# Patient Record
Sex: Female | Born: 2000 | Race: White | Hispanic: No | Marital: Single | Smoking: Never smoker
Health system: Southern US, Community
[De-identification: ages and names within clinical notes are randomized; demographics above are authoritative.]

## PROBLEM LIST (undated history)

## (undated) HISTORY — PX: TONSILLECTOMY: SUR1361

## (undated) HISTORY — PX: WISDOM TOOTH EXTRACTION: SHX21

---
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
## 2019-03-25 ENCOUNTER — Other Ambulatory Visit: Payer: Self-pay

## 2019-03-25 DIAGNOSIS — Z20822 Contact with and (suspected) exposure to covid-19: Secondary | ICD-10-CM

## 2019-03-27 ENCOUNTER — Emergency Department
Admission: EM | Admit: 2019-03-27 | Discharge: 2019-03-27 | Disposition: A | Discharge status: Home / Self Care | Payer: Self-pay | Attending: Emergency Medicine | Admitting: Emergency Medicine

## 2019-03-27 ENCOUNTER — Emergency Department: Payer: Self-pay

## 2019-03-27 ENCOUNTER — Encounter: Payer: Self-pay | Admitting: Emergency Medicine

## 2019-03-27 ENCOUNTER — Other Ambulatory Visit: Payer: Self-pay

## 2019-03-27 DIAGNOSIS — Z79899 Other long term (current) drug therapy: Secondary | ICD-10-CM | POA: Insufficient documentation

## 2019-03-27 DIAGNOSIS — H1033 Unspecified acute conjunctivitis, bilateral: Secondary | ICD-10-CM

## 2019-03-27 DIAGNOSIS — J029 Acute pharyngitis, unspecified: Secondary | ICD-10-CM | POA: Insufficient documentation

## 2019-03-27 DIAGNOSIS — H1089 Other conjunctivitis: Secondary | ICD-10-CM | POA: Insufficient documentation

## 2019-03-27 LAB — NOVEL CORONAVIRUS, NAA: SARS-CoV-2, NAA: NOT DETECTED

## 2019-03-27 LAB — GROUP A STREP BY PCR: Group A Strep by PCR: NOT DETECTED

## 2019-03-27 MED ORDER — TOBRAMYCIN 0.3 % OP SOLN: 2.0000 [drp] | OPHTHALMIC | 0 refills | Status: DC

## 2019-03-27 NOTE — ED Triage Notes (Signed)
Pt via POV from home. Pt c/o bilateral eye pain, swelling, and redness. Pt states that it been like this for about a week. Pt also c/o sore throat for a couple of days. Redness and swelling noted to both eyes  Pt is A&Ox4, respiration are equal and unlabored, Pt NAD at this time.

## 2019-03-27 NOTE — Discharge Instructions (Signed)
Follow-up with your regular doctor if not improving in 3 days.  Return emergency department if worsening.  Take medication as prescribed. 

## 2019-03-27 NOTE — ED Provider Notes (Signed)
[facility] Emergency Department Provider Note  ____________________________________________   None    (approximate)  I have reviewed the triage vital signs and the nursing notes.   HISTORY  Chief Complaint Eye Pain    HPI Kathryn Payne is a 18 y.o. female presents emergency department complaining of bilateral eye pain and swelling along with redness.  Symptoms for approximately 5 to 6 days.  Sore throat for 2 days.  She is also had some chest discomfort with a mild cough.  She states she was tested for Covid through the drive-through here at the hospital 3 days ago with a negative result.  She denies any known fever.  She states she is still symptomatic so she is afraid to go back to work.  She denies any vomiting or diarrhea.   No recent STD   History reviewed. No pertinent past medical history.  There are no active problems to display for this patient.   Past Surgical History:  Procedure Laterality Date  . TONSILLECTOMY    . WISDOM TOOTH EXTRACTION      Prior to Admission medications   Medication Sig Start Date End Date Taking? Authorizing Provider  tobramycin (TOBREX) 0.3 % ophthalmic solution Place 2 drops into the left eye every 4 (four) hours. 03/27/19   Caryn Section Linden Dolin, PA-C  albuterol (PROVENTIL HFA;VENTOLIN HFA) [phone] Base) MCG/ACT inhaler Inhale 2 puffs into the lungs every 4 (four) hours as needed for wheezing or shortness of breath. 06/16/18 03/27/19  Cuthriell, Charline Bills, PA-C    Allergies Patient has no known allergies.  History reviewed. No pertinent family history.  Social History Social History   Tobacco Use  . Smoking status: Never Smoker  . Smokeless tobacco: Never Used  Substance Use Topics  . Alcohol use: Never    Frequency: Never  . Drug use: Never    Review of Systems  Constitutional: No fever/chills Eyes: No visual changes.  Positive for red eyes ENT: Positive sore throat. Respiratory: Positive cough with  some chest discomfort Genitourinary: Negative for dysuria. Musculoskeletal: Negative for back pain. Skin: Negative for rash.    ____________________________________________   PHYSICAL EXAM:  VITAL SIGNS: ED Triage Vitals  Enc Vitals Group     BP 03/27/19 1608 124/76     Pulse Rate 03/27/19 1608 98     Resp 03/27/19 1608 16     Temp 03/27/19 1608 97.8 F (36.6 C)     Temp Source 03/27/19 1608 Oral     SpO2 03/27/19 1608 98 %     Weight 03/27/19 1609 100 lb (45.4 kg)     Height 03/27/19 1609 5' (1.524 m)     Head Circumference --      Peak Flow --      Pain Score 03/27/19 1608 10     Pain Loc --      Pain Edu? --      Excl. in [place]? --     Constitutional: Alert and oriented. Well appearing and in no acute distress. Eyes: Conjunctivae are injected bilaterally  head: Atraumatic. Nose: No congestion/rhinnorhea. Mouth/Throat: Mucous membranes are moist.   Neck:  supple no lymphadenopathy noted Cardiovascular: Normal rate, regular rhythm. Heart sounds are normal Respiratory: Normal respiratory effort.  No retractions, lungs c t a  GU: deferred Musculoskeletal: FROM all extremities, warm and well perfused Neurologic:  Normal speech and language.  Skin:  Skin is warm, dry and intact. No rash noted. Psychiatric: Mood and affect are normal. Speech  and behavior are normal.  ____________________________________________   LABS (all labs ordered are listed, but only abnormal results are displayed)  Labs Reviewed  GROUP A STREP BY PCR   ____________________________________________   ____________________________________________  RADIOLOGY  Chest x-ray is normal  ____________________________________________   PROCEDURES  Procedure(s) performed: No  Procedures    ____________________________________________   INITIAL IMPRESSION / ASSESSMENT AND PLAN / ED COURSE  Pertinent labs & imaging results that were available during my care of the patient were reviewed  by me and considered in my medical decision making (see chart for details).   Patient is 18 year old female presents to the emergency department complaining of red eyes, sore throat, cough with some chest discomfort.  Had a Covid test 3 days ago which was negative.  Physical exam shows conjunctiva to be injected, throat is red, lungs clear to auscultation  Strep test and chest x-ray ordered.    ----------------------------------------- 5:09 PM on 03/27/2019 -----------------------------------------  Chest x-ray is normal Strep test negative  Explained the results to the patient.  She was given tobramycin ophthalmic drops for the conjunctivitis.  Explained to her that her chest x-ray looks good I do not feel like she has Covid due to the negative chest x-ray and negative Covid test 3 days ago.  If she has improved symptoms she can return to work in 2 to 3 days.  She is given a work note.  She is discharged stable condition. Kathryn Payne was evaluated in Emergency Department on 03/27/2019 for the symptoms described in the history of present illness. She was evaluated in the context of the global COVID-19 pandemic, which necessitated consideration that the patient might be at risk for infection with the SARS-CoV-2 virus that causes COVID-19. Institutional protocols and algorithms that pertain to the evaluation of patients at risk for COVID-19 are in a state of rapid change based on information released by regulatory bodies including the CDC and federal and state organizations. These policies and algorithms were followed during the patient's care in the ED.   As part of my medical decision making, I reviewed the following data within the electronic MEDICAL RECORD NUMBER Nursing notes reviewed and incorporated, Labs reviewed , Old chart reviewed, Radiograph reviewed , Notes from prior ED visits and [place] Controlled Substance Database  ____________________________________________   FINAL CLINICAL  IMPRESSION(S) / ED DIAGNOSES  Final diagnoses:  Acute bacterial conjunctivitis of both eyes  Acute pharyngitis, unspecified etiology      NEW MEDICATIONS STARTED DURING THIS VISIT:  Discharge Medication List as of 03/27/2019  5:45 PM       Note:  This document was prepared using Dragon voice recognition software and may include unintentional dictation errors.    Faythe Ghee, PA-C 03/27/19 1755    Dionne Bucy, MD 03/28/19 [phone]

## 2019-03-27 NOTE — ED Notes (Signed)
Pt verbalized understanding of discharge instructions. NAD at this time. 

## 2020-01-28 ENCOUNTER — Emergency Department: Payer: Self-pay

## 2020-01-28 ENCOUNTER — Inpatient Hospital Stay
Admission: EM | Admit: 2020-01-28 | Discharge: 2020-02-01 | DRG: 872 | Disposition: A | Discharge status: Home / Self Care | Payer: Self-pay | Attending: Hospitalist | Admitting: Hospitalist

## 2020-01-28 ENCOUNTER — Encounter: Payer: Self-pay | Admitting: Emergency Medicine

## 2020-01-28 ENCOUNTER — Other Ambulatory Visit: Payer: Self-pay

## 2020-01-28 DIAGNOSIS — M940 Chondrocostal junction syndrome [Tietze]: Secondary | ICD-10-CM | POA: Diagnosis not present

## 2020-01-28 DIAGNOSIS — E871 Hypo-osmolality and hyponatremia: Secondary | ICD-10-CM | POA: Diagnosis present

## 2020-01-28 DIAGNOSIS — M62838 Other muscle spasm: Secondary | ICD-10-CM | POA: Diagnosis not present

## 2020-01-28 DIAGNOSIS — A419 Sepsis, unspecified organism: Principal | ICD-10-CM | POA: Diagnosis present

## 2020-01-28 DIAGNOSIS — K529 Noninfective gastroenteritis and colitis, unspecified: Principal | ICD-10-CM | POA: Diagnosis present

## 2020-01-28 DIAGNOSIS — Z79899 Other long term (current) drug therapy: Secondary | ICD-10-CM

## 2020-01-28 DIAGNOSIS — E876 Hypokalemia: Secondary | ICD-10-CM | POA: Diagnosis present

## 2020-01-28 DIAGNOSIS — R197 Diarrhea, unspecified: Secondary | ICD-10-CM

## 2020-01-28 DIAGNOSIS — R109 Unspecified abdominal pain: Secondary | ICD-10-CM

## 2020-01-28 DIAGNOSIS — Z20822 Contact with and (suspected) exposure to covid-19: Secondary | ICD-10-CM | POA: Diagnosis present

## 2020-01-28 LAB — CBC WITH DIFFERENTIAL/PLATELET
Abs Immature Granulocytes: 0.25 10*3/uL — ABNORMAL HIGH (ref 0.00–0.07)
Basophils Absolute: 0.1 10*3/uL (ref 0.0–0.1)
Basophils Relative: 0 %
Eosinophils Absolute: 0 10*3/uL (ref 0.0–0.5)
Eosinophils Relative: 0 %
HCT: 37.9 % (ref 36.0–46.0)
Hemoglobin: 13 g/dL (ref 12.0–15.0)
Immature Granulocytes: 1 %
Lymphocytes Relative: 2 %
Lymphs Abs: 0.5 10*3/uL — ABNORMAL LOW (ref 0.7–4.0)
MCH: 32.4 pg (ref 26.0–34.0)
MCHC: 34.3 g/dL (ref 30.0–36.0)
MCV: 94.5 fL (ref 80.0–100.0)
Monocytes Absolute: 1.1 10*3/uL — ABNORMAL HIGH (ref 0.1–1.0)
Monocytes Relative: 5 %
Neutro Abs: 21.1 10*3/uL — ABNORMAL HIGH (ref 1.7–7.7)
Neutrophils Relative %: 92 %
Platelets: 274 10*3/uL (ref 150–400)
RBC: 4.01 MIL/uL (ref 3.87–5.11)
RDW: 12.2 % (ref 11.5–15.5)
WBC: 23 10*3/uL — ABNORMAL HIGH (ref 4.0–10.5)
nRBC: 0 % (ref 0.0–0.2)

## 2020-01-28 LAB — URINALYSIS, COMPLETE (UACMP) WITH MICROSCOPIC
Bilirubin Urine: NEGATIVE
Glucose, UA: NEGATIVE mg/dL
Ketones, ur: 20 mg/dL — AB
Leukocytes,Ua: NEGATIVE
Nitrite: NEGATIVE
Protein, ur: 100 mg/dL — AB
Specific Gravity, Urine: 1.023 (ref 1.005–1.030)
pH: 5 (ref 5.0–8.0)

## 2020-01-28 LAB — PREGNANCY, URINE: Preg Test, Ur: NEGATIVE

## 2020-01-28 LAB — COMPREHENSIVE METABOLIC PANEL
ALT: 10 U/L (ref 0–44)
AST: 16 U/L (ref 15–41)
Albumin: 4.1 g/dL (ref 3.5–5.0)
Alkaline Phosphatase: 37 U/L — ABNORMAL LOW (ref 38–126)
Anion gap: 11 (ref 5–15)
BUN: 11 mg/dL (ref 6–20)
CO2: 22 mmol/L (ref 22–32)
Calcium: 8.4 mg/dL — ABNORMAL LOW (ref 8.9–10.3)
Chloride: 101 mmol/L (ref 98–111)
Creatinine, Ser: 0.9 mg/dL (ref 0.44–1.00)
GFR, Estimated: >60 mL/min (ref >60)
Glucose, Bld: 109 mg/dL — ABNORMAL HIGH (ref 70–99)
Potassium: 3 mmol/L — ABNORMAL LOW (ref 3.5–5.1)
Sodium: 134 mmol/L — ABNORMAL LOW (ref 135–145)
Total Bilirubin: 1.2 mg/dL (ref 0.3–1.2)
Total Protein: 7.9 g/dL (ref 6.5–8.1)

## 2020-01-28 LAB — RESPIRATORY PANEL BY RT PCR (FLU A&B, COVID)
Influenza A by PCR: NEGATIVE
Influenza B by PCR: NEGATIVE
SARS Coronavirus 2 by RT PCR: NEGATIVE

## 2020-01-28 MED ORDER — IOHEXOL 300 MG/ML  SOLN: 75.0000 mL | Freq: Once | INTRAMUSCULAR | Status: DC | PRN

## 2020-01-28 MED ORDER — PIPERACILLIN-TAZOBACTAM 3.375 G IVPB 30 MIN
3.3750 g | Freq: Once | INTRAVENOUS | Status: AC
  Administered 2020-01-28: 3.375 g via INTRAVENOUS
  Filled 2020-01-28: qty 50

## 2020-01-28 MED ORDER — SODIUM CHLORIDE 0.9 % IV BOLUS
1000.0000 mL | Freq: Once | INTRAVENOUS | Status: AC
  Administered 2020-01-28: 1000 mL via INTRAVENOUS

## 2020-01-28 MED ORDER — ENOXAPARIN SODIUM 40 MG/0.4ML ~~LOC~~ SOLN: 40.0000 mg | SUBCUTANEOUS | Status: DC

## 2020-01-28 MED ORDER — ACETAMINOPHEN 325 MG PO TABS: 650.0000 mg | ORAL_TABLET | Freq: Four times a day (QID) | ORAL | Status: DC | PRN

## 2020-01-28 MED ORDER — METRONIDAZOLE IN NACL 5-0.79 MG/ML-% IV SOLN
500.0000 mg | Freq: Three times a day (TID) | INTRAVENOUS | Status: DC
  Administered 2020-01-29 – 2020-02-01 (×10): 500 mg via INTRAVENOUS
  Filled 2020-01-28 (×14): qty 100

## 2020-01-28 MED ORDER — IOHEXOL 300 MG/ML  SOLN
90.0000 mL | Freq: Once | INTRAMUSCULAR | Status: AC | PRN
  Administered 2020-01-28: 90 mL via INTRAVENOUS

## 2020-01-28 MED ORDER — SODIUM CHLORIDE 0.9 % IV SOLN
2.0000 g | INTRAVENOUS | Status: DC
  Administered 2020-01-29 – 2020-01-31 (×3): 2 g via INTRAVENOUS
  Filled 2020-01-28 (×3): qty 20

## 2020-01-28 MED ORDER — ACETAMINOPHEN 650 MG RE SUPP
650.0000 mg | Freq: Four times a day (QID) | RECTAL | Status: DC | PRN
  Administered 2020-01-29: 650 mg via RECTAL
  Filled 2020-01-28: qty 1

## 2020-01-28 MED ORDER — SODIUM CHLORIDE 0.9 % IV SOLN: INTRAVENOUS | Status: DC

## 2020-01-28 MED ORDER — MORPHINE SULFATE (PF) 2 MG/ML IV SOLN
0.5000 mg | INTRAVENOUS | Status: DC | PRN
  Administered 2020-01-29 (×2): 0.5 mg via INTRAVENOUS
  Filled 2020-01-28 (×2): qty 1

## 2020-01-28 NOTE — ED Notes (Signed)
Provider notified of heart rate in the one-teens to 120s.

## 2020-01-28 NOTE — H&P (Signed)
History and Physical    Kathryn Payne ZOX:[id number] DOB: 15-Nov-2000 DOA: 01/28/2020  PCP: Patient, No Pcp Per  Patient coming from: Home I have personally briefly reviewed patient's [facility]  Chief Complaint: Abdominal pain and diarrhea  HPI: Kathryn PlummerJaden Beggs is a 19 y.o. female with no significant past medical history who presents with concerns of acute abdominal pain and diarrhea.  Yesterday patient noted acute onset of excruciating sharp umbilical pain that radiated to her right upper quadrant.  States it was so painful that she cannot get out of bed or urinate.  She noticed at least 5 episodes of black-colored runny stool.  Also felt nauseous and dizzy but denies any vomiting.  Had fever of 103 at home. No new foods or recent travel. No history of abdominal surgery.  Patient is sexually active and last had sex about 2 weeks ago.  Denies any history of abdominal issues in the past.  States mom's boyfriend who lives with her had some GI issues with vomiting a few days ago but this has improved.  No family history of Crohn's disease.  Denies tobacco or illicit drug use.  Occasional alcohol use but not recently.  ED Course: She had a temperature of 100F, was tachycardic and tachypneic on ambient air.  A significant leukocytosis of 23.  Mild hyponatremia 134, hypokalemia of 3. Negative UA, negative pregnancy test.  CT abdomen pelvis showed normal appendix but had thickening and fluid-filled distal ileum and colon suggestive of infectious/inflammatory enterocolitis.  Review of Systems: Constitutional: No Weight Change, + Fever ENT/Mouth: No sore throat, No Rhinorrhea Eyes: No Eye Pain, No Vision Changes Cardiovascular: No Chest Pain, no SOB Respiratory: No Cough, No Sputum Gastrointestinal: + Nausea, No Vomiting, + Diarrhea, No Constipation, + Pain Genitourinary: no Urinary Incontinence Musculoskeletal: No Arthralgias, No Myalgias Skin: No Skin Lesions, No  Pruritus, Neuro: no Weakness, No Numbness,  No Loss of Consciousness, No Syncope Psych: No Anxiety/Panic, No Depression, +decrease appetite Heme/Lymph: No Bruising, No Bleeding  Past Surgical History:  Procedure Laterality Date  . TONSILLECTOMY    . WISDOM TOOTH EXTRACTION       reports that she has never smoked. She has never used smokeless tobacco. She reports that she does not drink alcohol and does not use drugs. Social History  No Known Allergies  No family history on file.   Prior to Admission medications   Medication Sig Start Date End Date Taking? Authorizing Provider  tobramycin (TOBREX) 0.3 % ophthalmic solution Place 2 drops into the left eye every 4 (four) hours. Patient not taking: Reported on 01/28/2020 03/27/19   Faythe GheeFisher, Susan W, PA-C  albuterol (PROVENTIL HFA;VENTOLIN HFA) 108 [phone](90 Base) MCG/ACT inhaler Inhale 2 puffs into the lungs every 4 (four) hours as needed for wheezing or shortness of breath. 06/16/18 03/27/19  Racheal Patchesuthriell, Jonathan D, PA-C    Physical Exam: Vitals:   01/28/20 1844 01/28/20 1852 01/28/20 2100 01/28/20 2130  BP:  (!) 123/91 111/79 120/78  Pulse:  (!) 103 (!) 111 (!) 115  Resp:  (!) 25 (!) 24 (!) 24  Temp:  100 F (37.8 C)    TempSrc:  Oral    SpO2:  100% 99% 98%  Weight: 40.8 kg     Height: 5' (1.524 m)       Constitutional: NAD, calm, comfortable, nontoxic appearing thin young female laying flat in bed Vitals:   01/28/20 1844 01/28/20 1852 01/28/20 2100 01/28/20 2130  BP:  (!) 123/91 111/79  120/78  Pulse:  (!) 103 (!) 111 (!) 115  Resp:  (!) 25 (!) 24 (!) 24  Temp:  100 F (37.8 C)    TempSrc:  Oral    SpO2:  100% 99% 98%  Weight: 40.8 kg     Height: 5' (1.524 m)      Eyes: PERRL, lids and conjunctivae normal ENMT: Mucous membranes are moist.  Neck: normal, supple Respiratory: clear to auscultation bilaterally, no wheezing, no crackles. Normal respiratory effort. No accessory muscle use.  Cardiovascular: Tachycardia with normal  rhythm, no murmurs / rubs / gallops. No extremity edema.  Abdomen: Diffuse tenderness with mild palpation but no rebound, guarding or rigidity. Bowel sounds positive.  Musculoskeletal: no clubbing / cyanosis. No joint deformity upper and lower extremities. Good ROM, no contractures. Normal muscle tone.  Skin: no rashes, lesions, ulcers. No induration Neurologic: CN 2-12 grossly intact. Sensation intact,  Strength 5/5 in all 4.  Psychiatric: Normal judgment and insight. Alert and oriented x 3. Normal mood.     Labs on Admission: I have personally reviewed following labs and imaging studies  CBC: Recent Labs  Lab 01/28/20 1849  WBC 23.0*  NEUTROABS 21.1*  HGB 13.0  HCT 37.9  MCV 94.5  PLT 274   Basic Metabolic Panel: Recent Labs  Lab 01/28/20 1849  NA 134*  K 3.0*  CL 101  CO2 22  GLUCOSE 109*  BUN 11  CREATININE 0.90  CALCIUM 8.4*   GFR: Estimated Creatinine Clearance: 64.2 mL/min (by C-G formula based on SCr of 0.9 mg/dL). Liver Function Tests: Recent Labs  Lab 01/28/20 1849  AST 16  ALT 10  ALKPHOS 37*  BILITOT 1.2  PROT 7.9  ALBUMIN 4.1   No results for input(s): LIPASE, AMYLASE in the last 168 hours. No results for input(s): AMMONIA in the last 168 hours. Coagulation Profile: No results for input(s): INR, PROTIME in the last 168 hours. Cardiac Enzymes: No results for input(s): CKTOTAL, CKMB, CKMBINDEX, TROPONINI in the last 168 hours. BNP (last 3 results) No results for input(s): PROBNP in the last 8760 hours. HbA1C: No results for input(s): HGBA1C in the last 72 hours. CBG: No results for input(s): GLUCAP in the last 168 hours. Lipid Profile: No results for input(s): CHOL, HDL, LDLCALC, TRIG, CHOLHDL, LDLDIRECT in the last 72 hours. Thyroid Function Tests: No results for input(s): TSH, T4TOTAL, FREET4, T3FREE, THYROIDAB in the last 72 hours. Anemia Panel: No results for input(s): VITAMINB12, FOLATE, FERRITIN, TIBC, IRON, RETICCTPCT in the last 72  hours. Urine analysis:    Component Value Date/Time   COLORURINE AMBER (A) 01/28/2020 2000   APPEARANCEUR CLOUDY (A) 01/28/2020 2000   APPEARANCEUR Cloudy 01/16/2014 2332   LABSPEC 1.023 01/28/2020 2000   LABSPEC 1.014 01/16/2014 2332   PHURINE 5.0 01/28/2020 2000   GLUCOSEU NEGATIVE 01/28/2020 2000   GLUCOSEU Negative 01/16/2014 2332   HGBUR MODERATE (A) 01/28/2020 2000   BILIRUBINUR NEGATIVE 01/28/2020 2000   BILIRUBINUR Negative 01/16/2014 2332   KETONESUR 20 (A) 01/28/2020 2000   PROTEINUR 100 (A) 01/28/2020 2000   NITRITE NEGATIVE 01/28/2020 2000   LEUKOCYTESUR NEGATIVE 01/28/2020 2000   LEUKOCYTESUR 3+ 01/16/2014 2332    Radiological Exams on Admission: CT ABDOMEN PELVIS W CONTRAST  Result Date: 01/28/2020 CLINICAL DATA:  Right lower rib pain which began yesterday, loose bowel movements yesterday EXAM: CT ABDOMEN AND PELVIS WITH CONTRAST TECHNIQUE: Multidetector CT imaging of the abdomen and pelvis was performed using the standard protocol following bolus administration of intravenous  contrast. CONTRAST:  46mL OMNIPAQUE IOHEXOL 300 MG/ML  SOLN COMPARISON:  None. FINDINGS: Upper abdominal/lower chest imaging with extensive respiratory motion artifact, reimaged with improved acquisition. Lower chest: Lung bases are clear. Normal heart size. No pericardial effusion. Hepatobiliary: No worrisome focal liver lesions. Smooth liver surface contour. Normal hepatic attenuation. Normal gallbladder and biliary tree. Pancreas: Unremarkable. No pancreatic ductal dilatation or surrounding inflammatory changes. Spleen: Normal in size. No concerning splenic lesions. Adrenals/Urinary Tract: Normal adrenal glands. Kidneys are normally located with symmetric enhancement. No suspicious renal lesion, urolithiasis or hydronephrosis. Mild bladder wall thickening, more pronounced towards the bladder dome, possibly reactive. Stomach/Bowel: Distal esophagus and stomach are unremarkable. Duodenum with normal  sweep across the midline abdomen. No proximal small bowel thickening or dilatation. However, the ileum, most notably towards the terminal segments appears diffusely thickened and fluid-filled with adjacent hazy stranding of the mesentery and increased vascularity and numerous reactive adenopathy. Similar appearance seen throughout the colon with mild pancolonic mural thickening and mucosal hyperemia. Lack of formed stool suggesting the diarrheal illness/rapid transit state. A normal air-filled appendix is present in the right lower quadrant coursing towards the midline pelvis. No evidence of bowel obstruction. Vascular/Lymphatic: Numerous prominent though nonenlarged mesenteric nodes may be reactive. No pathologically enlarged abdominopelvic adenopathy within the included lymphatic chains. Increased vascularity of the mesentery, possibly reactive without other significant vascular findings. Reproductive: Normal anteverted uterus. Likely collapsing/involuting corpus luteum in the left ovary, normal physiologic finding in a reproductive age female. No concerning adnexal lesions. Other: Trace free fluid in the pelvis, possibly reactive or physiologic in a reproductive age female. Mesenteric hazy stranding noted predominantly in the anterior mesentery and omentum of the low anterior abdomen (2/66). No free abdominopelvic air. No bowel containing hernias. Musculoskeletal: No acute osseous abnormality or suspicious osseous lesion. Musculature is normal and symmetric. IMPRESSION: 1. Hyperemic, mildly thickened and fluid-filled distal ileum and colon. Lack of formed stool suggesting the diarrheal illness/rapid transit state. Findings are suggestive of an infectious or inflammatory enterocolitis. 2. Mild bladder wall thickening, more pronounced towards the bladder dome, possibly reactive in a reproductive age female. Recommend correlation for urinary symptoms and consideration of urinalysis to exclude cystitis. 3. Trace free  fluid in the pelvis, possibly reactive or physiologic in a reproductive age female. No free air. Electronically Signed   By: Kreg Shropshire M.D.   On: 01/28/2020 22:05      Assessment/Plan Sepsis secondary to enterocolitis Patient was febrile, tachycardic and tachypneic with elevated leukocytosis on admission Received 2 L of IV normal saline bolus in the ED.  Continue IV normal saline infusion Received IV Zosyn in the ED.  We will continue with IV Rocephin and Flagyl. Patient does not have personal history of abdominal issues or any family history of Crohn's disease so there is could just be an isolated incident.  Mild hyponatremia Sodium of 134 Follow labs in the morning following IV fluid  Mild hypokalemia K of 3.  Follow labs in the morning.  DVT prophylaxis:.Lovenox Code Status: Full Family Communication: Plan discussed with patient at bedside  disposition Plan: Home with observation Consults called:  Admission status: Observation  Status is: Observation  The patient remains OBS appropriate and will d/c before 2 midnights.  Dispo: The patient is from: Home              Anticipated d/c is to: Home              Anticipated d/c date is: 1 day  Patient currently is not medically stable to d/c.         Anselm Jungling DO [facility]   If 7PM-7AM, please contact night-coverage [email or web address]   01/28/2020, 11:46 PM

## 2020-01-28 NOTE — ED Triage Notes (Addendum)
Pt via EMS from home. Pt had severe RLQ abdominal pain that started yesterday, with some loose BM yesterday that pt states was black and tarry. Denies NV. Pt states at some point yesterday she was unable to control her stool.   103.0, mom gave 650 of Tylenol approx 2 hours ago, fever decreased to 101.3. HR 140s. 100% RA. 150/80, 111 CBG of Fentanyl and 4mg  of Zofran given by EMS

## 2020-01-28 NOTE — ED Provider Notes (Signed)
[facility] Emergency Department Provider Note   ____________________________________________   I have reviewed the triage vital signs and the nursing notes.   HISTORY  Chief Complaint Abdominal Pain   History limited by: Not Limited   HPI Kathryn Payne is a 19 y.o. female who presents to the emergency department today because of concerns for abdominal pain.  The pain started yesterday.  Initially located in the suprapubic region and is now located more in the right lower quadrant.  Patient describes it as sharp.  It has been constant.  It has been accompanied by diarrhea which has been black and watery.  She also thinks she is seeing some blood in it.  Patient denies any vaginal bleeding.  Patient has had fevers and chills.  Patient denies similar pain in the past.  Patient denies any history of abdominal problems.  Records reviewed. Per medical record review patient has a history of conjunctivitis, bronchitis.   There are no problems to display for this patient.   Past Surgical History:  Procedure Laterality Date   TONSILLECTOMY     WISDOM TOOTH EXTRACTION      Prior to Admission medications   Medication Sig Start Date End Date Taking? Authorizing Provider  tobramycin (TOBREX) 0.3 % ophthalmic solution Place 2 drops into the left eye every 4 (four) hours. 03/27/19   Sherrie Mustache Roselyn Bering, PA-C  albuterol (PROVENTIL HFA;VENTOLIN HFA) [phone] Base) MCG/ACT inhaler Inhale 2 puffs into the lungs every 4 (four) hours as needed for wheezing or shortness of breath. 06/16/18 03/27/19  Cuthriell, Delorise Royals, PA-C    Allergies Patient has no known allergies.  No family history on file.  Social History Social History   Tobacco Use   Smoking status: Never Smoker   Smokeless tobacco: Never Used  Substance Use Topics   Alcohol use: Never   Drug use: Never    Review of Systems Constitutional: Positive for fever.  Eyes: No visual changes. ENT: No sore  throat. Cardiovascular: Denies chest pain. Respiratory: Denies shortness of breath. Gastrointestinal: Positive for abdominal pain, diarrhea.  Genitourinary: Negative for dysuria. Musculoskeletal: Negative for back pain. Skin: Negative for rash. Neurological: Negative for headaches, focal weakness or numbness.  ____________________________________________   PHYSICAL EXAM:  VITAL SIGNS: ED Triage Vitals  Enc Vitals Group     BP 01/28/20 1852 (!) 123/91     Pulse Rate 01/28/20 1852 (!) 103     Resp 01/28/20 1852 (!) 25     Temp 01/28/20 1852 100 F (37.8 C)     Temp Source 01/28/20 1852 Oral     SpO2 01/28/20 1852 100 %     Weight 01/28/20 1844 90 lb (40.8 kg)     Height 01/28/20 1844 5' (1.524 m)     Head Circumference --      Peak Flow --      Pain Score 01/28/20 1844 8   Constitutional: Alert and oriented.  Eyes: Conjunctivae are normal.  ENT      Head: Normocephalic and atraumatic.      Nose: No congestion/rhinnorhea.      Mouth/Throat: Mucous membranes are moist.      Neck: No stridor. Hematological/Lymphatic/Immunilogical: No cervical lymphadenopathy. Cardiovascular: Normal rate, regular rhythm.  No murmurs, rubs, or gallops.  Respiratory: Normal respiratory effort without tachypnea nor retractions. Breath sounds are clear and equal bilaterally. No wheezes/rales/rhonchi. Gastrointestinal: Soft and tender to palpation in the right lower quadrant. Positive rovsings sign.  Genitourinary: Deferred Musculoskeletal: Normal range of  motion in all extremities. No lower extremity edema. Neurologic:  Normal speech and language. No gross focal neurologic deficits are appreciated.  Skin:  Skin is warm, dry and intact. No rash noted. Psychiatric: Mood and affect are normal. Speech and behavior are normal. Patient exhibits appropriate insight and judgment.  ____________________________________________    LABS (pertinent positives/negatives)  CMP na 134, k 3.0, glu 109, cr  0.90 UA moderate hgb dipstick, cloudy, 6-10 rbc, 11-20 wbc CBC wbc 23.0, hgb 13.0, plt 274  ____________________________________________   EKG  I, Phineas Semen, attending physician, personally viewed and interpreted this EKG  EKG Time: 1846 Rate: 113 Rhythm: sinus tachycardia Axis: normal Intervals: qtc 397 QRS: narrow ST changes: no st elevation Impression: abnormal ekg  ____________________________________________    RADIOLOGY  CT abd/pel Findings concerning for infection/inflammatory enterocolitis. Mild bladder wall thickening. Trace free fluid in the pelvis.  ____________________________________________   PROCEDURES  Procedures  ____________________________________________   INITIAL IMPRESSION / ASSESSMENT AND PLAN / ED COURSE  Pertinent labs & imaging results that were available during my care of the patient were reviewed by me and considered in my medical decision making (see chart for details).   Patient presented to the emergency department today because of concerns for abdominal pain.  On exam patient did have significant discomfort in the right lower quadrant.  Blood work did show a leukocytosis.  I did have concerns for possible appendicitis a CT abdomen pelvis was obtained.  While this was not consistent with appendicitis was concerning for possible infectious enterocolitis.  Given tachycardia and leukocytosis will plan on admission with IV antibiotics. Discussed finding and plan with patient.   ____________________________________________   FINAL CLINICAL IMPRESSION(S) / ED DIAGNOSES  Final diagnoses:  Enterocolitis     Note: This dictation was prepared with Dragon dictation. Any transcriptional errors that result from this process are unintentional     Phineas Semen, MD 01/29/20 1557

## 2020-01-29 ENCOUNTER — Encounter: Payer: Self-pay | Admitting: Internal Medicine

## 2020-01-29 ENCOUNTER — Inpatient Hospital Stay: Payer: Self-pay

## 2020-01-29 DIAGNOSIS — E871 Hypo-osmolality and hyponatremia: Secondary | ICD-10-CM

## 2020-01-29 DIAGNOSIS — K529 Noninfective gastroenteritis and colitis, unspecified: Secondary | ICD-10-CM

## 2020-01-29 DIAGNOSIS — A419 Sepsis, unspecified organism: Principal | ICD-10-CM

## 2020-01-29 DIAGNOSIS — E876 Hypokalemia: Secondary | ICD-10-CM

## 2020-01-29 LAB — GASTROINTESTINAL PANEL BY PCR, STOOL (REPLACES STOOL CULTURE)

## 2020-01-29 LAB — CBC
HCT: 31.1 % — ABNORMAL LOW (ref 36.0–46.0)
Hemoglobin: 10.6 g/dL — ABNORMAL LOW (ref 12.0–15.0)
MCH: 32.1 pg (ref 26.0–34.0)
MCHC: 34.1 g/dL (ref 30.0–36.0)
MCV: 94.2 fL (ref 80.0–100.0)
Platelets: 240 10*3/uL (ref 150–400)
RBC: 3.3 MIL/uL — ABNORMAL LOW (ref 3.87–5.11)
RDW: 12.5 % (ref 11.5–15.5)
WBC: 21.3 10*3/uL — ABNORMAL HIGH (ref 4.0–10.5)
nRBC: 0 % (ref 0.0–0.2)

## 2020-01-29 LAB — BASIC METABOLIC PANEL
Anion gap: 12 (ref 5–15)
BUN: 8 mg/dL (ref 6–20)
CO2: 17 mmol/L — ABNORMAL LOW (ref 22–32)
Calcium: 7.1 mg/dL — ABNORMAL LOW (ref 8.9–10.3)
Chloride: 103 mmol/L (ref 98–111)
Creatinine, Ser: 0.88 mg/dL (ref 0.44–1.00)
GFR, Estimated: >60 mL/min (ref >60)
Glucose, Bld: 84 mg/dL (ref 70–99)
Potassium: 2.5 mmol/L — CL (ref 3.5–5.1)
Sodium: 132 mmol/L — ABNORMAL LOW (ref 135–145)

## 2020-01-29 LAB — C DIFFICILE QUICK SCREEN W PCR REFLEX
C Diff antigen: NEGATIVE
C Diff interpretation: NOT DETECTED
C Diff toxin: NEGATIVE

## 2020-01-29 LAB — POTASSIUM: Potassium: 3.8 mmol/L (ref 3.5–5.1)

## 2020-01-29 LAB — HIV ANTIBODY (ROUTINE TESTING W REFLEX): HIV Screen 4th Generation wRfx: NONREACTIVE

## 2020-01-29 LAB — MAGNESIUM: Magnesium: 1.4 mg/dL — ABNORMAL LOW (ref 1.7–2.4)

## 2020-01-29 MED ORDER — MORPHINE SULFATE (PF) 2 MG/ML IV SOLN
2.0000 mg | Freq: Once | INTRAVENOUS | Status: AC
  Administered 2020-01-29: 2 mg via INTRAVENOUS
  Filled 2020-01-29: qty 1

## 2020-01-29 MED ORDER — ENOXAPARIN SODIUM 30 MG/0.3ML ~~LOC~~ SOLN
30.0000 mg | SUBCUTANEOUS | Status: DC
  Administered 2020-01-31: 30 mg via SUBCUTANEOUS
  Filled 2020-01-29 (×2): qty 0.3

## 2020-01-29 MED ORDER — MORPHINE SULFATE (PF) 2 MG/ML IV SOLN: 2.0000 mg | INTRAVENOUS | Status: DC | PRN

## 2020-01-29 MED ORDER — IOHEXOL 300 MG/ML  SOLN
80.0000 mL | Freq: Once | INTRAMUSCULAR | Status: AC | PRN
  Administered 2020-01-29: 80 mL via INTRAVENOUS
  Filled 2020-01-29: qty 80

## 2020-01-29 MED ORDER — POTASSIUM CHLORIDE 10 MEQ/100ML IV SOLN
10.0000 meq | INTRAVENOUS | Status: AC
  Administered 2020-01-29: 10 meq via INTRAVENOUS

## 2020-01-29 MED ORDER — ACETAMINOPHEN 325 MG PO TABS
650.0000 mg | ORAL_TABLET | ORAL | Status: DC | PRN
  Filled 2020-01-29 (×4): qty 2

## 2020-01-29 MED ORDER — MORPHINE SULFATE (PF) 2 MG/ML IV SOLN
INTRAVENOUS | Status: AC
  Administered 2020-01-29: 0.5 mg via INTRAVENOUS
  Filled 2020-01-29: qty 1

## 2020-01-29 MED ORDER — HYDROMORPHONE HCL 1 MG/ML IJ SOLN
0.5000 mg | INTRAMUSCULAR | Status: DC | PRN
  Administered 2020-01-29 (×3): 0.5 mg via INTRAVENOUS
  Filled 2020-01-29 (×3): qty 1

## 2020-01-29 MED ORDER — ACETAMINOPHEN 500 MG PO TABS
1000.0000 mg | ORAL_TABLET | ORAL | Status: AC
  Filled 2020-01-29: qty 2

## 2020-01-29 MED ORDER — ACETAMINOPHEN 650 MG RE SUPP
650.0000 mg | Freq: Four times a day (QID) | RECTAL | Status: DC | PRN
  Administered 2020-01-29: 650 mg via RECTAL
  Filled 2020-01-29: qty 1

## 2020-01-29 MED ORDER — ACETAMINOPHEN 325 MG PO TABS: 650.0000 mg | ORAL_TABLET | Freq: Four times a day (QID) | ORAL | Status: DC | PRN

## 2020-01-29 MED ORDER — MAGNESIUM SULFATE 2 GM/50ML IV SOLN
2.0000 g | Freq: Once | INTRAVENOUS | Status: AC
  Administered 2020-01-29: 2 g via INTRAVENOUS
  Filled 2020-01-29: qty 50

## 2020-01-29 MED ORDER — DICYCLOMINE HCL 20 MG PO TABS
20.0000 mg | ORAL_TABLET | Freq: Three times a day (TID) | ORAL | Status: DC
  Administered 2020-01-30 – 2020-02-01 (×9): 20 mg via ORAL
  Filled 2020-01-29 (×16): qty 1

## 2020-01-29 MED ORDER — IOHEXOL 9 MG/ML PO SOLN
500.0000 mL | ORAL | Status: AC
  Administered 2020-01-29: 500 mL via ORAL
  Filled 2020-01-29 (×2): qty 500

## 2020-01-29 MED ORDER — POTASSIUM CHLORIDE 10 MEQ/100ML IV SOLN
10.0000 meq | Freq: Once | INTRAVENOUS | Status: AC
  Administered 2020-01-29: 10 meq via INTRAVENOUS

## 2020-01-29 MED ORDER — HYDROMORPHONE HCL 1 MG/ML IJ SOLN
0.5000 mg | INTRAMUSCULAR | Status: DC | PRN
  Administered 2020-01-30 – 2020-02-01 (×13): 0.5 mg via INTRAVENOUS
  Filled 2020-01-29 (×14): qty 1

## 2020-01-29 MED ORDER — ONDANSETRON HCL 4 MG/2ML IJ SOLN
4.0000 mg | Freq: Four times a day (QID) | INTRAMUSCULAR | Status: DC | PRN
  Administered 2020-01-29 – 2020-01-30 (×4): 4 mg via INTRAVENOUS
  Filled 2020-01-29 (×5): qty 2

## 2020-01-29 MED ORDER — POTASSIUM CHLORIDE 10 MEQ/100ML IV SOLN
10.0000 meq | INTRAVENOUS | Status: AC
  Administered 2020-01-29 (×4): 10 meq via INTRAVENOUS
  Filled 2020-01-29 (×5): qty 100

## 2020-01-29 MED ORDER — POTASSIUM CHLORIDE CRYS ER 20 MEQ PO TBCR
40.0000 meq | EXTENDED_RELEASE_TABLET | Freq: Once | ORAL | Status: AC
  Administered 2020-01-29: 40 meq via ORAL
  Filled 2020-01-29: qty 2

## 2020-01-29 NOTE — Progress Notes (Signed)
   01/29/20 1644  Assess: MEWS Score  Temp 98.5 F (36.9 C)  BP 109/74  Pulse Rate (!) 115  Resp 18  SpO2 98 %  O2 Device Room Air  Assess: MEWS Score  MEWS Temp 0  MEWS Systolic 0  MEWS Pulse 2  MEWS RR 0  MEWS LOC 0  MEWS Score 2  MEWS Score Color Yellow  Assess: if the MEWS score is Yellow or Red  Were vital signs taken at a resting state? Yes  Focused Assessment No change from prior assessment  Early Detection of Sepsis Score *See Row Information* High  MEWS guidelines implemented *See Row Information* No, previously yellow, continue vital signs every 4 hours  Came from ED with Red MEWS. Vitals improved upon arrival to unit and MEWS now yellow.

## 2020-01-29 NOTE — ED Notes (Signed)
Pt with watery brown stool, pt cleaned and bedding and blanket changed, pt helped with bedpan for urination. Brief in place due to frequent small amounts of liquid stool.

## 2020-01-29 NOTE — ED Notes (Signed)
Pt cleaned and brief changed by this tech and Writer.

## 2020-01-29 NOTE — Progress Notes (Signed)
PHARMACIST - PHYSICIAN COMMUNICATION  CONCERNING:  Enoxaparin (Lovenox) for DVT Prophylaxis    RECOMMENDATION: Patient was prescribed enoxaparin 40mg  q24 hours for VTE prophylaxis.   Filed Weights   01/28/20 1844  Weight: 40.8 kg (90 lb)    Body mass index is 17.58 kg/m.  Estimated Creatinine Clearance: 65.7 mL/min (by C-G formula based on SCr of 0.88 mg/dL).  Patient is candidate for enoxaparin 30mg  every 24 hours based on CrCl <81ml/min or Weight <45kg  DESCRIPTION: Pharmacy has adjusted enoxaparin dose per [facility] policy.  Patient is now receiving enoxaparin 30 mg every 24 hours    31m 01/29/2020 11:32 AM

## 2020-01-29 NOTE — ED Notes (Signed)
Provider notified of vitals

## 2020-01-29 NOTE — ED Notes (Signed)
Provider notified that pt continues to have severe abdo pain. 10/10 pain right now. R side abdo very tender. Pt grimaced and guarding with light palpation.

## 2020-01-29 NOTE — ED Notes (Signed)
Date and time results received: 01/29/20 0516 (use smartphrase ".now" to insert current time)  Test: Potassium Critical Value: 2.5  Name of Provider Notified: Manuela Schwartz  Orders Received? Or Actions Taken?: Provider notified.

## 2020-01-29 NOTE — ED Notes (Signed)
Provider at bedside

## 2020-01-29 NOTE — ED Notes (Signed)
Pt cleaned and placed on purewick

## 2020-01-29 NOTE — ED Notes (Signed)
Pt offered Tylenol, pt refused

## 2020-01-29 NOTE — ED Notes (Signed)
Provided water per pt request. 

## 2020-01-29 NOTE — ED Notes (Signed)
Dr Renford Dills notified of pt's VS and pain and nausea

## 2020-01-29 NOTE — ED Notes (Signed)
Pt does not desire pain medication at this time.

## 2020-01-29 NOTE — Progress Notes (Signed)
PROGRESS NOTE    Kathryn Payne  NWG:[id number] DOB: 18-Sep-2000 DOA: 01/28/2020 PCP: Patient, No Pcp Per   Chief Complain: Abdominal pain, diarrhea  Brief Narrative:  Patient is a 19 year old female with no significant past medical history who presents to the emergency department with complaints of acute abdominal pain, diarrhea.  She reported noticing 5 episodes of black-colored stool.  Had fever of 103 at home.  No history of inflammatory bowel disease.  CT abdomen/pelvis showed enterocolitis .  Started on antibiotics.  GI pathogen panel will be followed.  Remains tachycardic,severe  Hypokalemic.  Assessment & Plan:   Principal Problem:   Sepsis (HCC) Active Problems:   Enterocolitis   Hyponatremia   Hypokalemia   Sepsis secondary to enterocolitis: Febrile, tachycardic and tachypneic, leukocytosis on admission.  Continue IV fluids.  Remains tachycardic this morning.  Complains of severe abdominal pain and was having diarrhea. Continue antibiotics. Negative  GI pathogen panel.  No history of inflammatory bowel disease. Low suspicion of C. difficile in this young female CT abdomen/pelvis showed enterocolitis. Since abdominal pain was very severe and her abdomen was distended this morning I repeated CT abdomen/pelvis with contrast Continue pain medications,tylenol for fever  Severe hypokalemia /hypomagnesemia: Potassium in the range of 2.  Follow-up magnesium.  Continue to monitor potassium and supplement.  Magnesium also low, supplemented         DVT prophylaxis:Lovenox Code Status: Full Family Communication: Discussed with mother on phone on 01/29/20 Status VH:QIONGEXBMis:inpatient   Dispo: The patient is from: Home              Anticipated d/c is to: Home              Anticipated d/c date is: 2 days              Patient currently is not medically stable to d/c.     Consultants: None  Procedures:None  Antimicrobials:  Anti-infectives (From admission, onward)   Start      Dose/Rate Route Frequency Ordered Stop   01/29/20 0400  metroNIDAZOLE (FLAGYL) IVPB 500 mg        500 mg 100 mL/hr over 60 Minutes Intravenous Every 8 hours 01/28/20 2328     01/29/20 0300  cefTRIAXone (ROCEPHIN) 2 g in sodium chloride 0.9 % 100 mL IVPB        2 g 200 mL/hr over 30 Minutes Intravenous Every 24 hours 01/28/20 2328     01/28/20 2230  piperacillin-tazobactam (ZOSYN) IVPB 3.375 g        3.375 g 100 mL/hr over 30 Minutes Intravenous  Once 01/28/20 2219 01/29/20 0338      Subjective: Patient seen and examined the bedside this morning.  In sinus tachycardia, and severe pain.  Complains of diarrhea.  Abdomen is distended and tender.  Objective: Vitals:   01/29/20 0300 01/29/20 0400 01/29/20 0500 01/29/20 0700  BP: (!) 111/56 (!) 111/51 113/64 (!) 104/59  Pulse:  (!) 117 (!) 119 (!) 123  Resp:  (!) 27 (!) 27 (!) 28  Temp:      TempSrc:      SpO2: 96% 98% 100% 99%  Weight:      Height:        Intake/Output Summary (Last 24 hours) at 01/29/2020 0827 Last data filed at 01/29/2020 0813 Gross per 24 hour  Intake 907.54 ml  Output --  Net 907.54 ml   Filed Weights   01/28/20 1844  Weight: 40.8 kg    Examination:  General  exam: Uncomfortable due to abdominal pain HEENT:PERRL,Oral mucosa moist, Ear/Nose normal on gross exam Respiratory system: Bilateral equal air entry, normal vesicular breath sounds, no wheezes or crackles  Cardiovascular system: Sinus tachycardia no JVD, murmurs, rubs, gallops or clicks. No pedal edema. Gastrointestinal system: Abdomen is distended, soft and has generalized tenderness, mild distention. No organomegaly or masses felt. Normal bowel sounds heard. Central nervous system: Alert and oriented. No focal neurological deficits. Extremities: No edema, no clubbing ,no cyanosis, distal peripheral pulses palpable. Skin: No rashes, lesions or ulcers,no icterus ,no pallor.     Data Reviewed: I have personally reviewed following labs and  imaging studies  CBC: Recent Labs  Lab 01/28/20 1849 01/29/20 0421  WBC 23.0* 21.3*  NEUTROABS 21.1*  --   HGB 13.0 10.6*  HCT 37.9 31.1*  MCV 94.5 94.2  PLT 274 240   Basic Metabolic Panel: Recent Labs  Lab 01/28/20 1849 01/29/20 0421  NA 134* 132*  K 3.0* 2.5*  CL 101 103  CO2 22 17*  GLUCOSE 109* 84  BUN 11 8  CREATININE 0.90 0.88  CALCIUM 8.4* 7.1*   GFR: Estimated Creatinine Clearance: 65.7 mL/min (by C-G formula based on SCr of 0.88 mg/dL). Liver Function Tests: Recent Labs  Lab 01/28/20 1849  AST 16  ALT 10  ALKPHOS 37*  BILITOT 1.2  PROT 7.9  ALBUMIN 4.1   No results for input(s): LIPASE, AMYLASE in the last 168 hours. No results for input(s): AMMONIA in the last 168 hours. Coagulation Profile: No results for input(s): INR, PROTIME in the last 168 hours. Cardiac Enzymes: No results for input(s): CKTOTAL, CKMB, CKMBINDEX, TROPONINI in the last 168 hours. BNP (last 3 results) No results for input(s): PROBNP in the last 8760 hours. HbA1C: No results for input(s): HGBA1C in the last 72 hours. CBG: No results for input(s): GLUCAP in the last 168 hours. Lipid Profile: No results for input(s): CHOL, HDL, LDLCALC, TRIG, CHOLHDL, LDLDIRECT in the last 72 hours. Thyroid Function Tests: No results for input(s): TSH, T4TOTAL, FREET4, T3FREE, THYROIDAB in the last 72 hours. Anemia Panel: No results for input(s): VITAMINB12, FOLATE, FERRITIN, TIBC, IRON, RETICCTPCT in the last 72 hours. Sepsis Labs: No results for input(s): PROCALCITON, LATICACIDVEN in the last 168 hours.  Recent Results (from the past 240 hour(s))  Respiratory Panel by RT PCR (Flu A&B, Covid) -     Status: None   Collection Time: 01/28/20  7:53 PM   Specimen: Nasopharyngeal  Result Value Ref Range Status   SARS Coronavirus 2 by RT PCR NEGATIVE NEGATIVE Final    Comment: (NOTE) SARS-CoV-2 target nucleic acids are NOT DETECTED.  The SARS-CoV-2 RNA is generally detectable in upper  respiratoy specimens during the acute phase of infection. The lowest concentration of SARS-CoV-2 viral copies this assay can detect is 131 copies/mL. A negative result does not preclude SARS-Cov-2 infection and should not be used as the sole basis for treatment or other patient management decisions. A negative result may occur with  improper specimen collection/handling, submission of specimen other than nasopharyngeal swab, presence of viral mutation(s) within the areas targeted by this assay, and inadequate number of viral copies (<131 copies/mL). A negative result must be combined with clinical observations, patient history, and epidemiological information. The expected result is Negative.  Fact Sheet for Patients:  https://www.moore.com/  Fact Sheet for Healthcare Providers:  https://www.young.biz/  This test is no t yet approved or cleared by the [place] FDA and  has been authorized for detection and/or  diagnosis of SARS-CoV-2 by FDA under an Emergency Use Authorization (EUA). This EUA will remain  in effect (meaning this test can be used) for the duration of the COVID-19 declaration under Section 564(b)(1) of the Act, 21 U.S.C. section 360bbb-3(b)(1), unless the authorization is terminated or revoked sooner.     Influenza A by PCR NEGATIVE NEGATIVE Final   Influenza B by PCR NEGATIVE NEGATIVE Final    Comment: (NOTE) The Xpert Xpress SARS-CoV-2/FLU/RSV assay is intended as an aid in  the diagnosis of influenza from Nasopharyngeal swab specimens and  should not be used as a sole basis for treatment. Nasal washings and  aspirates are unacceptable for Xpert Xpress SARS-CoV-2/FLU/RSV  testing.  Fact Sheet for Patients: https://www.moore.com/  Fact Sheet for Healthcare Providers: https://www.young.biz/  This test is not yet approved or cleared by the [place] FDA and  has been  authorized for detection and/or diagnosis of SARS-CoV-2 by  FDA under an Emergency Use Authorization (EUA). This EUA will remain  in effect (meaning this test can be used) for the duration of the  Covid-19 declaration under Section 564(b)(1) of the Act, 21  U.S.C. section 360bbb-3(b)(1), unless the authorization is  terminated or revoked. Performed at [facility], [address] [place], [place] [address]          Radiology Studies: CT ABDOMEN PELVIS W CONTRAST  Result Date: 01/28/2020 CLINICAL DATA:  Right lower rib pain which began yesterday, loose bowel movements yesterday EXAM: CT ABDOMEN AND PELVIS WITH CONTRAST TECHNIQUE: Multidetector CT imaging of the abdomen and pelvis was performed using the standard protocol following bolus administration of intravenous contrast. CONTRAST:  34mL OMNIPAQUE IOHEXOL 300 MG/ML  SOLN COMPARISON:  None. FINDINGS: Upper abdominal/lower chest imaging with extensive respiratory motion artifact, reimaged with improved acquisition. Lower chest: Lung bases are clear. Normal heart size. No pericardial effusion. Hepatobiliary: No worrisome focal liver lesions. Smooth liver surface contour. Normal hepatic attenuation. Normal gallbladder and biliary tree. Pancreas: Unremarkable. No pancreatic ductal dilatation or surrounding inflammatory changes. Spleen: Normal in size. No concerning splenic lesions. Adrenals/Urinary Tract: Normal adrenal glands. Kidneys are normally located with symmetric enhancement. No suspicious renal lesion, urolithiasis or hydronephrosis. Mild bladder wall thickening, more pronounced towards the bladder dome, possibly reactive. Stomach/Bowel: Distal esophagus and stomach are unremarkable. Duodenum with normal sweep across the midline abdomen. No proximal small bowel thickening or dilatation. However, the ileum, most notably towards the terminal segments appears diffusely thickened and fluid-filled with adjacent hazy stranding of the  mesentery and increased vascularity and numerous reactive adenopathy. Similar appearance seen throughout the colon with mild pancolonic mural thickening and mucosal hyperemia. Lack of formed stool suggesting the diarrheal illness/rapid transit state. A normal air-filled appendix is present in the right lower quadrant coursing towards the midline pelvis. No evidence of bowel obstruction. Vascular/Lymphatic: Numerous prominent though nonenlarged mesenteric nodes may be reactive. No pathologically enlarged abdominopelvic adenopathy within the included lymphatic chains. Increased vascularity of the mesentery, possibly reactive without other significant vascular findings. Reproductive: Normal anteverted uterus. Likely collapsing/involuting corpus luteum in the left ovary, normal physiologic finding in a reproductive age female. No concerning adnexal lesions. Other: Trace free fluid in the pelvis, possibly reactive or physiologic in a reproductive age female. Mesenteric hazy stranding noted predominantly in the anterior mesentery and omentum of the low anterior abdomen (2/66). No free abdominopelvic air. No bowel containing hernias. Musculoskeletal: No acute osseous abnormality or suspicious osseous lesion. Musculature is normal and symmetric. IMPRESSION: 1. Hyperemic, mildly thickened and fluid-filled  distal ileum and colon. Lack of formed stool suggesting the diarrheal illness/rapid transit state. Findings are suggestive of an infectious or inflammatory enterocolitis. 2. Mild bladder wall thickening, more pronounced towards the bladder dome, possibly reactive in a reproductive age female. Recommend correlation for urinary symptoms and consideration of urinalysis to exclude cystitis. 3. Trace free fluid in the pelvis, possibly reactive or physiologic in a reproductive age female. No free air. Electronically Signed   By: Kreg Shropshire M.D.   On: 01/28/2020 22:05        Scheduled Meds:  enoxaparin (LOVENOX)  injection  40 mg Subcutaneous Q24H   potassium chloride  40 mEq Oral Once   Continuous Infusions:  sodium chloride 75 mL/hr at 01/29/20 0335   cefTRIAXone (ROCEPHIN)  IV Stopped (01/29/20 9381)   metronidazole Stopped (01/29/20 0527)   potassium chloride 10 mEq (01/29/20 0815)     LOS: 0 days    Time spent: 35 mins.More than 50% of that time was spent in counseling and/or coordination of care.      Burnadette Pop, MD [facility] P10/01/2020, 8:27 AM

## 2020-01-29 NOTE — ED Notes (Signed)
Patient transported to CT 

## 2020-01-29 NOTE — ED Notes (Signed)
Report to Maureen

## 2020-01-30 DIAGNOSIS — K529 Noninfective gastroenteritis and colitis, unspecified: Secondary | ICD-10-CM

## 2020-01-30 DIAGNOSIS — E876 Hypokalemia: Secondary | ICD-10-CM

## 2020-01-30 LAB — CBC WITH DIFFERENTIAL/PLATELET
Abs Immature Granulocytes: 0.12 10*3/uL — ABNORMAL HIGH (ref 0.00–0.07)
Basophils Absolute: 0 10*3/uL (ref 0.0–0.1)
Basophils Relative: 0 %
Eosinophils Absolute: 0 10*3/uL (ref 0.0–0.5)
Eosinophils Relative: 0 %
HCT: 30.5 % — ABNORMAL LOW (ref 36.0–46.0)
Hemoglobin: 10.6 g/dL — ABNORMAL LOW (ref 12.0–15.0)
Immature Granulocytes: 1 %
Lymphocytes Relative: 4 %
Lymphs Abs: 0.6 10*3/uL — ABNORMAL LOW (ref 0.7–4.0)
MCH: 32.6 pg (ref 26.0–34.0)
MCHC: 34.8 g/dL (ref 30.0–36.0)
MCV: 93.8 fL (ref 80.0–100.0)
Monocytes Absolute: 0.5 10*3/uL (ref 0.1–1.0)
Monocytes Relative: 4 %
Neutro Abs: 13.9 10*3/uL — ABNORMAL HIGH (ref 1.7–7.7)
Neutrophils Relative %: 91 %
Platelets: 215 10*3/uL (ref 150–400)
RBC: 3.25 MIL/uL — ABNORMAL LOW (ref 3.87–5.11)
RDW: 12.7 % (ref 11.5–15.5)
WBC Morphology: INCREASED
WBC: 15.2 10*3/uL — ABNORMAL HIGH (ref 4.0–10.5)
nRBC: 0 % (ref 0.0–0.2)

## 2020-01-30 LAB — BASIC METABOLIC PANEL
Anion gap: 9 (ref 5–15)
BUN: 7 mg/dL (ref 6–20)
CO2: 20 mmol/L — ABNORMAL LOW (ref 22–32)
Calcium: 7.3 mg/dL — ABNORMAL LOW (ref 8.9–10.3)
Chloride: 106 mmol/L (ref 98–111)
Creatinine, Ser: 0.55 mg/dL (ref 0.44–1.00)
GFR, Estimated: >60 mL/min (ref >60)
Glucose, Bld: 87 mg/dL (ref 70–99)
Potassium: 3.5 mmol/L (ref 3.5–5.1)
Sodium: 135 mmol/L (ref 135–145)

## 2020-01-30 LAB — URINE CULTURE: Culture: <10000 — AB

## 2020-01-30 LAB — MAGNESIUM: Magnesium: 2.2 mg/dL (ref 1.7–2.4)

## 2020-01-30 MED ORDER — POTASSIUM CHLORIDE 20 MEQ PO PACK
40.0000 meq | PACK | Freq: Once | ORAL | Status: AC
  Administered 2020-01-30: 40 meq via ORAL
  Filled 2020-01-30: qty 2

## 2020-01-30 MED ORDER — LOPERAMIDE HCL 2 MG PO CAPS
2.0000 mg | ORAL_CAPSULE | ORAL | Status: DC | PRN
  Administered 2020-01-30: 2 mg via ORAL
  Filled 2020-01-30: qty 1

## 2020-01-30 NOTE — Consult Note (Signed)
Melodie Bouillon, MD [address], [address], [place], [place], [address] [address], [place], [place], [address] Phone: [phone]  Fax: [phone]  Consultation  Referring Provider:     Dr. Renford Dills Primary Care Physician:  Patient, No Pcp Per Reason for Consultation:     Abdominal pain, diarrhea, N/V  Date of Admission:  01/28/2020 Date of Consultation:  01/30/2020         HPI:   Kathryn Payne is a 19 y.o. female who presents with 2-3 day history of abdominal pain, nausea vomiting and diarrhea.  Possible associated blood in stool, but patient is not entirely sure about this as she states she may also be on her period and the blood that she sees in the stool may be from her menses.  No prior history of similar symptoms.  Reports multiple loose bowel movements a day since symptoms started.  Denies any recent travel.  No sick contacts.  Did have fever on presentation.  Prior to this, she reports a bowel movement every other day that is formed, with no blood.  Abdominal pain described as diffuse, dull, 5/10, nonradiating, associated with nausea and vomiting.  States she does feel better since admission.  History reviewed. No pertinent past medical history.  Past Surgical History:  Procedure Laterality Date  . TONSILLECTOMY    . WISDOM TOOTH EXTRACTION      Prior to Admission medications   Medication Sig Start Date End Date Taking? Authorizing Provider  tobramycin (TOBREX) 0.3 % ophthalmic solution Place 2 drops into the left eye every 4 (four) hours. Patient not taking: Reported on 01/28/2020 03/27/19   Faythe Ghee, PA-C  albuterol (PROVENTIL HFA;VENTOLIN HFA) 108 [phone] Base) MCG/ACT inhaler Inhale 2 puffs into the lungs every 4 (four) hours as needed for wheezing or shortness of breath. 06/16/18 03/27/19  Cuthriell, Delorise Royals, PA-C    History reviewed. No pertinent family history.   Social History   Tobacco Use  . Smoking status: Never Smoker  .  Smokeless tobacco: Never Used  Substance Use Topics  . Alcohol use: Never  . Drug use: Never    Allergies as of 01/28/2020  . (No Known Allergies)    Review of Systems:    All systems reviewed and negative except where noted in HPI.   Physical Exam:  Vital signs in last 24 hours: Vitals:   01/30/20 0017 01/30/20 0432 01/30/20 0739 01/30/20 1136  BP: 108/70 99/65 109/66 111/82  Pulse: (!) 108 85 70 62  Resp: 18 16 16 16   Temp: 98.4 F (36.9 C) 98 F (36.7 C) 98.4 F (36.9 C) 97.9 F (36.6 C)  TempSrc: Oral  Oral Oral  SpO2: 98% 96% 100% 100%  Weight:  45 kg    Height:       Last BM Date: 01/29/20 General:   Pleasant, cooperative in NAD Head:  Normocephalic and atraumatic. Eyes:   No icterus.   Conjunctiva pink. PERRLA. Ears:  Normal auditory acuity. Neck:  Supple; no masses or thyroidomegaly Lungs: Respirations even and unlabored. Lungs clear to auscultation bilaterally.   No wheezes, crackles, or rhonchi.  Abdomen:  Soft, nondistended, nontender. Normal bowel sounds. No appreciable masses or hepatomegaly.  No rebound or guarding.  Neurologic:  Alert and oriented x3;  grossly normal neurologically. Skin:  Intact without significant lesions or rashes. Cervical Nodes:  No significant cervical adenopathy. Psych:  Alert and cooperative. Normal affect.  LAB RESULTS: Recent Labs    01/28/20 1849  01/29/20 0421 01/30/20 0523  WBC 23.0* 21.3* 15.2*  HGB 13.0 10.6* 10.6*  HCT 37.9 31.1* 30.5*  PLT 274 240 215   BMET Recent Labs    01/28/20 1849 01/28/20 1849 01/29/20 0421 01/29/20 1640 01/30/20 0523  NA 134*  --  132*  --  135  K 3.0*   < > 2.5* 3.8 3.5  CL 101  --  103  --  106  CO2 22  --  17*  --  20*  GLUCOSE 109*  --  84  --  87  BUN 11  --  8  --  7  CREATININE 0.90  --  0.88  --  0.55  CALCIUM 8.4*  --  7.1*  --  7.3*   < > = values in this interval not displayed.   LFT Recent Labs    01/28/20 1849  PROT 7.9  ALBUMIN 4.1  AST 16  ALT 10    ALKPHOS 37*  BILITOT 1.2   PT/INR No results for input(s): LABPROT, INR in the last 72 hours.  STUDIES: CT ABDOMEN PELVIS W CONTRAST  Result Date: 01/29/2020 CLINICAL DATA:  Abdominal distension. Persistent pain. Follow-up exam. EXAM: CT ABDOMEN AND PELVIS WITH CONTRAST TECHNIQUE: Multidetector CT imaging of the abdomen and pelvis was performed using the standard protocol following bolus administration of intravenous contrast. CONTRAST:  104mL OMNIPAQUE IOHEXOL 300 MG/ML  SOLN COMPARISON:  CT abdomen pelvis 01/28/2020 FINDINGS: Lower chest: Normal heart size. Subpleural ground-glass and consolidative opacities within the lower lobes bilaterally favored to represent atelectasis. Hepatobiliary: Liver is normal in size and contour. Gallbladder is unremarkable. Pancreas: Unremarkable Spleen: Unremarkable Adrenals/Urinary Tract: Normal adrenal glands. Kidneys enhance symmetrically with contrast. No hydronephrosis. Contrast/dense material within the urinary bladder. Stomach/Bowel: Fluid throughout the colon. No evidence for small bowel distension there is mild wall thickening of the distal ileum. Few scattered mildly thickened loops of small bowel within the central abdomen. There is wall thickening and small amount of fluid about the cecum and ascending colon. Normal morphology of the stomach and proximal duodenum. No free intraperitoneal air. Vascular/Lymphatic: Normal caliber abdominal aorta. No retroperitoneal lymphadenopathy. Reproductive: Uterus and adnexal structures are unremarkable. Other: None. Musculoskeletal: No aggressive or acute appearing osseous lesions. IMPRESSION: 1. Fluid throughout the colon as can be seen with diarrhea. 2. There is wall thickening and small amount of fluid about the cecum and ascending colon. Additionally, there are a few mildly thick walled loops of small bowel within the central abdomen. Findings are nonspecific however may be secondary to an infectious or inflammatory  process. 3. Small amount of fluid within the lower abdomen. 4. Subpleural ground-glass and consolidative opacities within the lower lobes bilaterally favored to represent atelectasis. Electronically Signed   By: Annia Belt M.D.   On: 01/29/2020 14:15   CT ABDOMEN PELVIS W CONTRAST  Result Date: 01/28/2020 CLINICAL DATA:  Right lower rib pain which began yesterday, loose bowel movements yesterday EXAM: CT ABDOMEN AND PELVIS WITH CONTRAST TECHNIQUE: Multidetector CT imaging of the abdomen and pelvis was performed using the standard protocol following bolus administration of intravenous contrast. CONTRAST:  61mL OMNIPAQUE IOHEXOL 300 MG/ML  SOLN COMPARISON:  None. FINDINGS: Upper abdominal/lower chest imaging with extensive respiratory motion artifact, reimaged with improved acquisition. Lower chest: Lung bases are clear. Normal heart size. No pericardial effusion. Hepatobiliary: No worrisome focal liver lesions. Smooth liver surface contour. Normal hepatic attenuation. Normal gallbladder and biliary tree. Pancreas: Unremarkable. No pancreatic ductal dilatation or surrounding inflammatory changes. Spleen:  Normal in size. No concerning splenic lesions. Adrenals/Urinary Tract: Normal adrenal glands. Kidneys are normally located with symmetric enhancement. No suspicious renal lesion, urolithiasis or hydronephrosis. Mild bladder wall thickening, more pronounced towards the bladder dome, possibly reactive. Stomach/Bowel: Distal esophagus and stomach are unremarkable. Duodenum with normal sweep across the midline abdomen. No proximal small bowel thickening or dilatation. However, the ileum, most notably towards the terminal segments appears diffusely thickened and fluid-filled with adjacent hazy stranding of the mesentery and increased vascularity and numerous reactive adenopathy. Similar appearance seen throughout the colon with mild pancolonic mural thickening and mucosal hyperemia. Lack of formed stool suggesting the  diarrheal illness/rapid transit state. A normal air-filled appendix is present in the right lower quadrant coursing towards the midline pelvis. No evidence of bowel obstruction. Vascular/Lymphatic: Numerous prominent though nonenlarged mesenteric nodes may be reactive. No pathologically enlarged abdominopelvic adenopathy within the included lymphatic chains. Increased vascularity of the mesentery, possibly reactive without other significant vascular findings. Reproductive: Normal anteverted uterus. Likely collapsing/involuting corpus luteum in the left ovary, normal physiologic finding in a reproductive age female. No concerning adnexal lesions. Other: Trace free fluid in the pelvis, possibly reactive or physiologic in a reproductive age female. Mesenteric hazy stranding noted predominantly in the anterior mesentery and omentum of the low anterior abdomen (2/66). No free abdominopelvic air. No bowel containing hernias. Musculoskeletal: No acute osseous abnormality or suspicious osseous lesion. Musculature is normal and symmetric. IMPRESSION: 1. Hyperemic, mildly thickened and fluid-filled distal ileum and colon. Lack of formed stool suggesting the diarrheal illness/rapid transit state. Findings are suggestive of an infectious or inflammatory enterocolitis. 2. Mild bladder wall thickening, more pronounced towards the bladder dome, possibly reactive in a reproductive age female. Recommend correlation for urinary symptoms and consideration of urinalysis to exclude cystitis. 3. Trace free fluid in the pelvis, possibly reactive or physiologic in a reproductive age female. No free air. Electronically Signed   By: Kreg Shropshire M.D.   On: 01/28/2020 22:05      Impression / Plan:   William Laske is a 19 y.o. y/o female with abdominal pain, nausea vomiting and diarrhea for 2 to 3 days, with no prior history of similar symptoms  Infectious work-up was negative on presentation  However, given acuity of symptoms,  with no prior history of similar symptoms, and improvement with antibiotics since admission, improvement, white count, point to likely infectious etiology  However, given CT findings, inflammatory bowel disease is still on the differential  Patient is only on day 2 of antibiotics at this time  Would recommend continuing antibiotics, and if symptoms do not improve further in the next 24 hours (which would be on day 3 of antibiotics), can consider colonoscopy for further evaluation  Since symptoms are currently improving, it would be useful to continue to allow for improvement before proceeding with endoscopic procedures  We will continue to follow  Thank you for involving me in the care of this patient.      LOS: 1 day   Pasty Spillers, MD  01/30/2020, 2:56 PM

## 2020-01-30 NOTE — Progress Notes (Signed)
PROGRESS NOTE    Kathryn Payne  TFT:[id number] DOB: 05-04-2000 DOA: 01/28/2020 PCP: Kathryn Payne, No Pcp Per   Chief Complain: Abdominal pain, diarrhea  Brief Narrative:  Kathryn Payne is a 19 year old female with no significant past medical history who presents to the emergency department with complaints of acute abdominal pain, diarrhea.  She reported noticing 5 episodes of black-colored stool.  Had fever of 103 at home.  No history of inflammatory bowel disease.  CT abdomen/pelvis showed enterocolitis .  Started on antibiotics.  GI consulted today with suspicion of inflammatory bowel disease.  Assessment & Plan:   Principal Problem:   Sepsis (HCC) Active Problems:   Enterocolitis   Hyponatremia   Hypokalemia   Sepsis secondary to enterocolitis: Febrile, tachycardic and tachypneic, leukocytosis on admission.  Complained of severe abdominal pain and was having diarrhea on presentation.  Started on antibiotics, pain medications. Negative  GI pathogen panel and Cdiff.  No history of inflammatory bowel disease.CT abdomen/pelvis showed enterocolitis. Her sepsis physiology has been improving and this morning she was not tachycardic or febrile.  Leukocytosis is improving.  We will continue current antibiotics.  Follow-up blood cultures Still complains of nausea and diarrhea this morning.  But there is significant improvement from yesterday.  Continue clear liquid diet.  Severe hypokalemia /hypomagnesemia: Potassium was in the range of 2.Magnesium also low.Supplemented and corrected. We will continue to monitor.         DVT prophylaxis:Lovenox Code Status: Full Family Communication: Discussed with mother at bedisde Status HC:[id number]   Dispo: The Kathryn Payne is from: Home              Anticipated d/c is to: Home              Anticipated d/c date is:1 day              Kathryn Payne currently is not medically stable to d/c.  Likely discharge to  home tomorrow if improvement in the diarrhea and  nausea.   Consultants: None  Procedures:None  Antimicrobials:  Anti-infectives (From admission, onward)   Start     Dose/Rate Route Frequency Ordered Stop   01/29/20 0400  metroNIDAZOLE (FLAGYL) IVPB 500 mg        500 mg 100 mL/hr over 60 Minutes Intravenous Every 8 hours 01/28/20 2328     01/29/20 0300  cefTRIAXone (ROCEPHIN) 2 g in sodium chloride 0.9 % 100 mL IVPB        2 g 200 mL/hr over 30 Minutes Intravenous Every 24 hours 01/28/20 2328     01/28/20 2230  piperacillin-tazobactam (ZOSYN) IVPB 3.375 g        3.375 g 100 mL/hr over 30 Minutes Intravenous  Once 01/28/20 2219 01/29/20 0338      Subjective:  Kathryn Payne seen and examined at the bedside this morning.  Hemodynamically stable.  She looks better today.  But she still complains of nausea and diarrhea.  Mother was at the bedside.  Abdomen not tender distended like yesterday.  Objective: Vitals:   01/29/20 1949 01/30/20 0017 01/30/20 0432 01/30/20 0739  BP: 95/72 108/70 99/65 109/66  Pulse: (!) 119 (!) 108 85 70  Resp: 19 18 16    Temp: (!) 102 F (38.9 C) 98.4 F (36.9 C) 98 F (36.7 C) 98.4 F (36.9 C)  TempSrc: Oral Oral  Oral  SpO2: 98% 98% 96% 100%  Weight:   45 kg   Height:        Intake/Output Summary (Last 24 hours) at 01/30/2020 03/31/2020  Last data filed at 01/30/2020 0009 Gross per 24 hour  Intake 575 ml  Output 850 ml  Net -275 ml   Filed Weights   01/28/20 1844 01/29/20 1644 01/30/20 0432  Weight: 40.8 kg 45.9 kg 45 kg    Examination:  General exam: Appears calm and comfortable ,Not in distress,average built HEENT:PERRL,Oral mucosa moist, Ear/Nose normal on gross exam Respiratory system: Bilateral equal air entry, normal vesicular breath sounds, no wheezes or crackles  Cardiovascular system: S1 & S2 heard, RRR. No JVD, murmurs, rubs, gallops or clicks. Gastrointestinal system: Abdomen is is mildly distended, soft and mild generalized tenderness. No organomegaly or masses felt. Hyperactive   bowel sounds heard. Central nervous system: Alert and oriented. No focal neurological deficits. Extremities: No edema, no clubbing ,no cyanosis, distal peripheral pulses palpable. Skin: No rashes, lesions or ulcers,no icterus ,no pallor    Data Reviewed: I have personally reviewed following labs and imaging studies  CBC: Recent Labs  Lab 01/28/20 1849 01/29/20 0421 01/30/20 0523  WBC 23.0* 21.3* 15.2*  NEUTROABS 21.1*  --  13.9*  HGB 13.0 10.6* 10.6*  HCT 37.9 31.1* 30.5*  MCV 94.5 94.2 93.8  PLT 274 240 215   Basic Metabolic Panel: Recent Labs  Lab 01/28/20 1849 01/29/20 0421 01/29/20 1640 01/30/20 0523  NA 134* 132*  --  135  K 3.0* 2.5* 3.8 3.5  CL 101 103  --  106  CO2 22 17*  --  20*  GLUCOSE 109* 84  --  87  BUN 11 8  --  7  CREATININE 0.90 0.88  --  0.55  CALCIUM 8.4* 7.1*  --  7.3*  MG  --  1.4*  --  2.2   GFR: Estimated Creatinine Clearance: 79.7 mL/min (by C-G formula based on SCr of 0.55 mg/dL). Liver Function Tests: Recent Labs  Lab 01/28/20 1849  AST 16  ALT 10  ALKPHOS 37*  BILITOT 1.2  PROT 7.9  ALBUMIN 4.1   No results for input(s): LIPASE, AMYLASE in the last 168 hours. No results for input(s): AMMONIA in the last 168 hours. Coagulation Profile: No results for input(s): INR, PROTIME in the last 168 hours. Cardiac Enzymes: No results for input(s): CKTOTAL, CKMB, CKMBINDEX, TROPONINI in the last 168 hours. BNP (last 3 results) No results for input(s): PROBNP in the last 8760 hours. HbA1C: No results for input(s): HGBA1C in the last 72 hours. CBG: No results for input(s): GLUCAP in the last 168 hours. Lipid Profile: No results for input(s): CHOL, HDL, LDLCALC, TRIG, CHOLHDL, LDLDIRECT in the last 72 hours. Thyroid Function Tests: No results for input(s): TSH, T4TOTAL, FREET4, T3FREE, THYROIDAB in the last 72 hours. Anemia Panel: No results for input(s): VITAMINB12, FOLATE, FERRITIN, TIBC, IRON, RETICCTPCT in the last 72 hours. Sepsis  Labs: No results for input(s): PROCALCITON, LATICACIDVEN in the last 168 hours.  Recent Results (from the past 240 hour(s))  Respiratory Panel by RT PCR (Flu A&B, Covid) -     Status: None   Collection Time: 01/28/20  7:53 PM   Specimen: Nasopharyngeal  Result Value Ref Range Status   SARS Coronavirus 2 by RT PCR NEGATIVE NEGATIVE Final    Comment: (NOTE) SARS-CoV-2 target nucleic acids are NOT DETECTED.  The SARS-CoV-2 RNA is generally detectable in upper respiratoy specimens during the acute phase of infection. The lowest concentration of SARS-CoV-2 viral copies this assay can detect is 131 copies/mL. A negative result does not preclude SARS-Cov-2 infection and should not be used as  the sole basis for treatment or other Kathryn Payne management decisions. A negative result may occur with  improper specimen collection/handling, submission of specimen other than nasopharyngeal swab, presence of viral mutation(s) within the areas targeted by this assay, and inadequate number of viral copies (<131 copies/mL). A negative result must be combined with clinical observations, Kathryn Payne history, and epidemiological information. The expected result is Negative.  Fact Sheet for Patients:  https://www.moore.com/https://www.fda.gov/media/142436/download  Fact Sheet for Healthcare Providers:  https://www.young.biz/https://www.fda.gov/media/142435/download  This test is no t yet approved or cleared by the [place] FDA and  has been authorized for detection and/or diagnosis of SARS-CoV-2 by FDA under an Emergency Use Authorization (EUA). This EUA will remain  in effect (meaning this test can be used) for the duration of the COVID-19 declaration under Section 564(b)(1) of the Act, 21 U.S.C. section 360bbb-3(b)(1), unless the authorization is terminated or revoked sooner.     Influenza A by PCR NEGATIVE NEGATIVE Final   Influenza B by PCR NEGATIVE NEGATIVE Final    Comment: (NOTE) The Xpert Xpress SARS-CoV-2/FLU/RSV assay is intended as  an aid in  the diagnosis of influenza from Nasopharyngeal swab specimens and  should not be used as a sole basis for treatment. Nasal washings and  aspirates are unacceptable for Xpert Xpress SARS-CoV-2/FLU/RSV  testing.  Fact Sheet for Patients: https://www.moore.com/https://www.fda.gov/media/142436/download  Fact Sheet for Healthcare Providers: https://www.young.biz/https://www.fda.gov/media/142435/download  This test is not yet approved or cleared by the [place] FDA and  has been authorized for detection and/or diagnosis of SARS-CoV-2 by  FDA under an Emergency Use Authorization (EUA). This EUA will remain  in effect (meaning this test can be used) for the duration of the  Covid-19 declaration under Section 564(b)(1) of the Act, 21  U.S.C. section 360bbb-3(b)(1), unless the authorization is  terminated or revoked. Performed at [facility], [address] [place], [place] [address]   Gastrointestinal Panel by PCR , Stool     Status: None   Collection Time: 01/29/20 10:06 AM   Specimen: Stool  Result Value Ref Range Status   Campylobacter species NOT DETECTED NOT DETECTED Final   Plesimonas shigelloides NOT DETECTED NOT DETECTED Final   Salmonella species NOT DETECTED NOT DETECTED Final   Yersinia enterocolitica NOT DETECTED NOT DETECTED Final   Vibrio species NOT DETECTED NOT DETECTED Final   Vibrio cholerae NOT DETECTED NOT DETECTED Final   Enteroaggregative E coli (EAEC) NOT DETECTED NOT DETECTED Final   Enteropathogenic E coli (EPEC) NOT DETECTED NOT DETECTED Final   Enterotoxigenic E coli (ETEC) NOT DETECTED NOT DETECTED Final   Shiga like toxin producing E coli (STEC) NOT DETECTED NOT DETECTED Final   Shigella/Enteroinvasive E coli (EIEC) NOT DETECTED NOT DETECTED Final   Cryptosporidium NOT DETECTED NOT DETECTED Final   Cyclospora cayetanensis NOT DETECTED NOT DETECTED Final   Entamoeba histolytica NOT DETECTED NOT DETECTED Final   Giardia lamblia NOT DETECTED NOT DETECTED Final   Adenovirus  F40/41 NOT DETECTED NOT DETECTED Final   Astrovirus NOT DETECTED NOT DETECTED Final   Norovirus GI/GII NOT DETECTED NOT DETECTED Final   Rotavirus A NOT DETECTED NOT DETECTED Final   Sapovirus (I, II, IV, and V) NOT DETECTED NOT DETECTED Final    Comment: Performed at [facility], [address] [place], [place] [address]  CULTURE, BLOOD (ROUTINE X 2) w Reflex to ID Panel     Status: None (Preliminary result)   Collection Time: 01/29/20 10:06 AM   Specimen: BLOOD  Result Value Ref Range  Status   Specimen Description BLOOD RIGHT ANTECUBITAL  Final   Special Requests   Final    BOTTLES DRAWN AEROBIC AND ANAEROBIC Blood Culture results may not be optimal due to an excessive volume of blood received in culture bottles   Culture   Final    NO GROWTH < 24 HOURS Performed at [facility], [address] [place], [place] [address]    Report Status PENDING  Incomplete  CULTURE, BLOOD (ROUTINE X 2) w Reflex to ID Panel     Status: None (Preliminary result)   Collection Time: 01/29/20 10:06 AM   Specimen: BLOOD  Result Value Ref Range Status   Specimen Description BLOOD RIGHT ANTECUBITAL  Final   Special Requests   Final    BOTTLES DRAWN AEROBIC AND ANAEROBIC Blood Culture results may not be optimal due to an excessive volume of blood received in culture bottles   Culture   Final    NO GROWTH < 24 HOURS Performed at [facility], [address] [place], [place] [address]    Report Status PENDING  Incomplete  C Difficile Quick Screen w PCR reflex     Status: None   Collection Time: 01/29/20 10:06 AM   Specimen: STOOL  Result Value Ref Range Status   C Diff antigen NEGATIVE NEGATIVE Final   C Diff toxin NEGATIVE NEGATIVE Final   C Diff interpretation No C. difficile detected.  Final    Comment: Performed at [facility], [address], [place], [place] [address]         Radiology Studies: CT ABDOMEN PELVIS W CONTRAST  Result Date:  01/29/2020 CLINICAL DATA:  Abdominal distension. Persistent pain. Follow-up exam. EXAM: CT ABDOMEN AND PELVIS WITH CONTRAST TECHNIQUE: Multidetector CT imaging of the abdomen and pelvis was performed using the standard protocol following bolus administration of intravenous contrast. CONTRAST:  41mL OMNIPAQUE IOHEXOL 300 MG/ML  SOLN COMPARISON:  CT abdomen pelvis 01/28/2020 FINDINGS: Lower chest: Normal heart size. Subpleural ground-glass and consolidative opacities within the lower lobes bilaterally favored to represent atelectasis. Hepatobiliary: Liver is normal in size and contour. Gallbladder is unremarkable. Pancreas: Unremarkable Spleen: Unremarkable Adrenals/Urinary Tract: Normal adrenal glands. Kidneys enhance symmetrically with contrast. No hydronephrosis. Contrast/dense material within the urinary bladder. Stomach/Bowel: Fluid throughout the colon. No evidence for small bowel distension there is mild wall thickening of the distal ileum. Few scattered mildly thickened loops of small bowel within the central abdomen. There is wall thickening and small amount of fluid about the cecum and ascending colon. Normal morphology of the stomach and proximal duodenum. No free intraperitoneal air. Vascular/Lymphatic: Normal caliber abdominal aorta. No retroperitoneal lymphadenopathy. Reproductive: Uterus and adnexal structures are unremarkable. Other: None. Musculoskeletal: No aggressive or acute appearing osseous lesions. IMPRESSION: 1. Fluid throughout the colon as can be seen with diarrhea. 2. There is wall thickening and small amount of fluid about the cecum and ascending colon. Additionally, there are a few mildly thick walled loops of small bowel within the central abdomen. Findings are nonspecific however may be secondary to an infectious or inflammatory process. 3. Small amount of fluid within the lower abdomen. 4. Subpleural ground-glass and consolidative opacities within the lower lobes bilaterally favored to  represent atelectasis. Electronically Signed   By: Annia Belt M.D.   On: 01/29/2020 14:15   CT ABDOMEN PELVIS W CONTRAST  Result Date: 01/28/2020 CLINICAL DATA:  Right lower rib pain which began yesterday, loose bowel movements yesterday EXAM: CT ABDOMEN AND PELVIS WITH CONTRAST  TECHNIQUE: Multidetector CT imaging of the abdomen and pelvis was performed using the standard protocol following bolus administration of intravenous contrast. CONTRAST:  78mL OMNIPAQUE IOHEXOL 300 MG/ML  SOLN COMPARISON:  None. FINDINGS: Upper abdominal/lower chest imaging with extensive respiratory motion artifact, reimaged with improved acquisition. Lower chest: Lung bases are clear. Normal heart size. No pericardial effusion. Hepatobiliary: No worrisome focal liver lesions. Smooth liver surface contour. Normal hepatic attenuation. Normal gallbladder and biliary tree. Pancreas: Unremarkable. No pancreatic ductal dilatation or surrounding inflammatory changes. Spleen: Normal in size. No concerning splenic lesions. Adrenals/Urinary Tract: Normal adrenal glands. Kidneys are normally located with symmetric enhancement. No suspicious renal lesion, urolithiasis or hydronephrosis. Mild bladder wall thickening, more pronounced towards the bladder dome, possibly reactive. Stomach/Bowel: Distal esophagus and stomach are unremarkable. Duodenum with normal sweep across the midline abdomen. No proximal small bowel thickening or dilatation. However, the ileum, most notably towards the terminal segments appears diffusely thickened and fluid-filled with adjacent hazy stranding of the mesentery and increased vascularity and numerous reactive adenopathy. Similar appearance seen throughout the colon with mild pancolonic mural thickening and mucosal hyperemia. Lack of formed stool suggesting the diarrheal illness/rapid transit state. A normal air-filled appendix is present in the right lower quadrant coursing towards the midline pelvis. No evidence of  bowel obstruction. Vascular/Lymphatic: Numerous prominent though nonenlarged mesenteric nodes may be reactive. No pathologically enlarged abdominopelvic adenopathy within the included lymphatic chains. Increased vascularity of the mesentery, possibly reactive without other significant vascular findings. Reproductive: Normal anteverted uterus. Likely collapsing/involuting corpus luteum in the left ovary, normal physiologic finding in a reproductive age female. No concerning adnexal lesions. Other: Trace free fluid in the pelvis, possibly reactive or physiologic in a reproductive age female. Mesenteric hazy stranding noted predominantly in the anterior mesentery and omentum of the low anterior abdomen (2/66). No free abdominopelvic air. No bowel containing hernias. Musculoskeletal: No acute osseous abnormality or suspicious osseous lesion. Musculature is normal and symmetric. IMPRESSION: 1. Hyperemic, mildly thickened and fluid-filled distal ileum and colon. Lack of formed stool suggesting the diarrheal illness/rapid transit state. Findings are suggestive of an infectious or inflammatory enterocolitis. 2. Mild bladder wall thickening, more pronounced towards the bladder dome, possibly reactive in a reproductive age female. Recommend correlation for urinary symptoms and consideration of urinalysis to exclude cystitis. 3. Trace free fluid in the pelvis, possibly reactive or physiologic in a reproductive age female. No free air. Electronically Signed   By: Kreg Shropshire M.D.   On: 01/28/2020 22:05        Scheduled Meds: . acetaminophen  1,000 mg Oral STAT  . dicyclomine  20 mg Oral TID AC & HS  . enoxaparin (LOVENOX) injection  30 mg Subcutaneous Q24H   Continuous Infusions: . sodium chloride 125 mL/hr at 01/30/20 0004  . cefTRIAXone (ROCEPHIN)  IV 2 g (01/30/20 0359)  . metronidazole 500 mg (01/30/20 0458)     LOS: 1 day    Time spent: 25 mins.More than 50% of that time was spent in counseling and/or  coordination of care.      Burnadette Pop, MD [facility] P10/02/2020, 8:14 AM

## 2020-01-31 LAB — CBC WITH DIFFERENTIAL/PLATELET
Abs Immature Granulocytes: 0.06 10*3/uL (ref 0.00–0.07)
Basophils Absolute: 0 10*3/uL (ref 0.0–0.1)
Basophils Relative: 0 %
Eosinophils Absolute: 0 10*3/uL (ref 0.0–0.5)
Eosinophils Relative: 0 %
HCT: 29 % — ABNORMAL LOW (ref 36.0–46.0)
Hemoglobin: 10 g/dL — ABNORMAL LOW (ref 12.0–15.0)
Immature Granulocytes: 1 %
Lymphocytes Relative: 9 %
Lymphs Abs: 0.9 10*3/uL (ref 0.7–4.0)
MCH: 32.4 pg (ref 26.0–34.0)
MCHC: 34.5 g/dL (ref 30.0–36.0)
MCV: 93.9 fL (ref 80.0–100.0)
Monocytes Absolute: 0.8 10*3/uL (ref 0.1–1.0)
Monocytes Relative: 7 %
Neutro Abs: 9 10*3/uL — ABNORMAL HIGH (ref 1.7–7.7)
Neutrophils Relative %: 83 %
Platelets: 235 10*3/uL (ref 150–400)
RBC: 3.09 MIL/uL — ABNORMAL LOW (ref 3.87–5.11)
RDW: 12.7 % (ref 11.5–15.5)
WBC: 10.8 10*3/uL — ABNORMAL HIGH (ref 4.0–10.5)
nRBC: 0 % (ref 0.0–0.2)

## 2020-01-31 LAB — BASIC METABOLIC PANEL
Anion gap: 8 (ref 5–15)
BUN: 9 mg/dL (ref 6–20)
CO2: 23 mmol/L (ref 22–32)
Calcium: 7.4 mg/dL — ABNORMAL LOW (ref 8.9–10.3)
Chloride: 104 mmol/L (ref 98–111)
Creatinine, Ser: 0.6 mg/dL (ref 0.44–1.00)
GFR, Estimated: >60 mL/min (ref >60)
Glucose, Bld: 85 mg/dL (ref 70–99)
Potassium: 3.3 mmol/L — ABNORMAL LOW (ref 3.5–5.1)
Sodium: 135 mmol/L (ref 135–145)

## 2020-01-31 MED ORDER — SODIUM CHLORIDE 0.9 % IV SOLN
1.0000 g | INTRAVENOUS | Status: DC
  Administered 2020-02-01: 1 g via INTRAVENOUS
  Filled 2020-01-31: qty 1
  Filled 2020-01-31: qty 10

## 2020-01-31 MED ORDER — METHOCARBAMOL 1000 MG/10ML IJ SOLN
500.0000 mg | Freq: Four times a day (QID) | INTRAVENOUS | Status: DC | PRN
  Administered 2020-01-31: 500 mg via INTRAVENOUS
  Filled 2020-01-31 (×2): qty 5

## 2020-01-31 MED ORDER — POTASSIUM CHLORIDE CRYS ER 20 MEQ PO TBCR
40.0000 meq | EXTENDED_RELEASE_TABLET | Freq: Once | ORAL | Status: AC
  Administered 2020-01-31: 40 meq via ORAL
  Filled 2020-01-31: qty 2

## 2020-01-31 MED ORDER — INFLUENZA VAC SPLIT QUAD 0.5 ML IM SUSY: 0.5000 mL | PREFILLED_SYRINGE | INTRAMUSCULAR | Status: DC | PRN

## 2020-01-31 MED ORDER — OXYCODONE HCL 5 MG PO TABS
5.0000 mg | ORAL_TABLET | Freq: Four times a day (QID) | ORAL | Status: DC | PRN
  Administered 2020-01-31 – 2020-02-01 (×3): 5 mg via ORAL
  Filled 2020-01-31 (×3): qty 1

## 2020-01-31 MED ORDER — PEG 3350-KCL-NA BICARB-NACL 420 G PO SOLR
4000.0000 mL | Freq: Once | ORAL | Status: AC
  Administered 2020-01-31: 4000 mL via ORAL
  Filled 2020-01-31: qty 4000

## 2020-01-31 NOTE — Progress Notes (Signed)
Kathryn Bouillon, MD [address], [place], [place], [address] [address], [address], [place], [place], [address] Phone: [phone]  Fax: [phone]   Subjective: Patient reports improvement in diarrhea but continues to report abdominal pain. No nausea or vomiting. Is on clear liquid diet but states does not have the appetite for it.   Objective: Exam: Vital signs in last 24 hours: Vitals:   01/31/20 0356 01/31/20 0825 01/31/20 1148 01/31/20 1657  BP: 126/72 125/84 109/74 121/84  Pulse: 74 (!) 101 66 (!) 101  Resp: 16 20 18 19   Temp: 99.2 F (37.3 C) 98.4 F (36.9 C) 99.8 F (37.7 C) 99.9 F (37.7 C)  TempSrc: Oral Oral Oral Oral  SpO2: 98% 98% 96% 100%  Weight: 47.5 kg     Height:       Weight change: 1.633 kg  Intake/Output Summary (Last 24 hours) at 01/31/2020 1714 Last data filed at 01/31/2020 1457 Gross per 24 hour  Intake 0 ml  Output 200 ml  Net -200 ml    General: No acute distress, AAO x3 Abd: Soft, tender to mild palpation diffusely, No HSM Skin: Warm, no rashes Neck: Supple, Trachea midline   Lab Results: Lab Results  Component Value Date   WBC 10.8 (H) 01/31/2020   HGB 10.0 (L) 01/31/2020   HCT 29.0 (L) 01/31/2020   MCV 93.9 01/31/2020   PLT 235 01/31/2020   Micro Results: Recent Results (from the past 240 hour(s))  Respiratory Panel by RT PCR (Flu A&B, Covid) -     Status: None   Collection Time: 01/28/20  7:53 PM   Specimen: Nasopharyngeal  Result Value Ref Range Status   SARS Coronavirus 2 by RT PCR NEGATIVE NEGATIVE Final    Comment: (NOTE) SARS-CoV-2 target nucleic acids are NOT DETECTED.  The SARS-CoV-2 RNA is generally detectable in upper respiratoy specimens during the acute phase of infection. The lowest concentration of SARS-CoV-2 viral copies this assay can detect is 131 copies/mL. A negative result does not preclude SARS-Cov-2 infection and should not be used as the sole basis for treatment or other  patient management decisions. A negative result may occur with  improper specimen collection/handling, submission of specimen other than nasopharyngeal swab, presence of viral mutation(s) within the areas targeted by this assay, and inadequate number of viral copies (<131 copies/mL). A negative result must be combined with clinical observations, patient history, and epidemiological information. The expected result is Negative.  Fact Sheet for Patients:  03/29/20  Fact Sheet for Healthcare Providers:  https://www.moore.com/  This test is no t yet approved or cleared by the https://www.young.biz/ FDA and  has been authorized for detection and/or diagnosis of SARS-CoV-2 by FDA under an Emergency Use Authorization (EUA). This EUA will remain  in effect (meaning this test can be used) for the duration of the COVID-19 declaration under Section 564(b)(1) of the Act, 21 U.S.C. section 360bbb-3(b)(1), unless the authorization is terminated or revoked sooner.     Influenza A by PCR NEGATIVE NEGATIVE Final   Influenza B by PCR NEGATIVE NEGATIVE Final    Comment: (NOTE) The Xpert Xpress SARS-CoV-2/FLU/RSV assay is intended as an aid in  the diagnosis of influenza from Nasopharyngeal swab specimens and  should not be used as a sole basis for treatment. Nasal washings and  aspirates are unacceptable for Xpert Xpress SARS-CoV-2/FLU/RSV  testing.  Fact Sheet for Patients: [place]  Fact Sheet for Healthcare Providers: https://www.moore.com/  This test is not yet approved or  cleared by the [place] and  has been authorized for detection and/or diagnosis of SARS-CoV-2 by  FDA under an Emergency Use Authorization (EUA). This EUA will remain  in effect (meaning this test can be used) for the duration of the  Covid-19 declaration under Section 564(b)(1) of the Act, 21  U.S.C. section  360bbb-3(b)(1), unless the authorization is  terminated or revoked. Performed at [facility], [address], [place], [place] [address]   Urine Culture     Status: Abnormal   Collection Time: 01/28/20  8:00 PM   Specimen: Urine, Random  Result Value Ref Range Status   Specimen Description   Final    URINE, RANDOM Performed at [facility], [address], [place], [place] [address]    Special Requests   Final    NONE Performed at [facility], [address] [place], [place] [address]    Culture (A)  Final    <10,000 COLONIES/mL INSIGNIFICANT GROWTH Performed at [facility], [address], [place], [place] [address]    Report Status 01/30/2020 FINAL  Final  Gastrointestinal Panel by PCR , Stool     Status: None   Collection Time: 01/29/20 10:06 AM   Specimen: Stool  Result Value Ref Range Status   Campylobacter species NOT DETECTED NOT DETECTED Final   Plesimonas shigelloides NOT DETECTED NOT DETECTED Final   Salmonella species NOT DETECTED NOT DETECTED Final   Yersinia enterocolitica NOT DETECTED NOT DETECTED Final   Vibrio species NOT DETECTED NOT DETECTED Final   Vibrio cholerae NOT DETECTED NOT DETECTED Final   Enteroaggregative E coli (EAEC) NOT DETECTED NOT DETECTED Final   Enteropathogenic E coli (EPEC) NOT DETECTED NOT DETECTED Final   Enterotoxigenic E coli (ETEC) NOT DETECTED NOT DETECTED Final   Shiga like toxin producing E coli (STEC) NOT DETECTED NOT DETECTED Final   Shigella/Enteroinvasive E coli (EIEC) NOT DETECTED NOT DETECTED Final   Cryptosporidium NOT DETECTED NOT DETECTED Final   Cyclospora cayetanensis NOT DETECTED NOT DETECTED Final   Entamoeba histolytica NOT DETECTED NOT DETECTED Final   Giardia lamblia NOT DETECTED NOT DETECTED Final   Adenovirus F40/41 NOT DETECTED NOT DETECTED Final   Astrovirus NOT DETECTED NOT DETECTED Final   Norovirus GI/GII NOT DETECTED NOT DETECTED Final   Rotavirus A NOT DETECTED  NOT DETECTED Final   Sapovirus (I, II, IV, and V) NOT DETECTED NOT DETECTED Final    Comment: Performed at [facility], [address] [place], [place] [address]  CULTURE, BLOOD (ROUTINE X 2) w Reflex to ID Panel     Status: None (Preliminary result)   Collection Time: 01/29/20 10:06 AM   Specimen: BLOOD  Result Value Ref Range Status   Specimen Description BLOOD RIGHT ANTECUBITAL  Final   Special Requests   Final    BOTTLES DRAWN AEROBIC AND ANAEROBIC Blood Culture results may not be optimal due to an excessive volume of blood received in culture bottles   Culture   Final    NO GROWTH 2 DAYS Performed at [facility], [address] [place], [place] [address]    Report Status PENDING  Incomplete  CULTURE, BLOOD (ROUTINE X 2) w Reflex to ID Panel     Status: None (Preliminary result)   Collection Time: 01/29/20 10:06 AM   Specimen: BLOOD  Result Value Ref Range Status   Specimen Description BLOOD RIGHT ANTECUBITAL  Final   Special Requests   Final  BOTTLES DRAWN AEROBIC AND ANAEROBIC Blood Culture results may not be optimal due to an excessive volume of blood received in culture bottles   Culture   Final    NO GROWTH 2 DAYS Performed at [facility], [address] [place], [place] [address]    Report Status PENDING  Incomplete  C Difficile Quick Screen w PCR reflex     Status: None   Collection Time: 01/29/20 10:06 AM   Specimen: STOOL  Result Value Ref Range Status   C Diff antigen NEGATIVE NEGATIVE Final   C Diff toxin NEGATIVE NEGATIVE Final   C Diff interpretation No C. difficile detected.  Final    Comment: Performed at [facility], [address] [place], [place] [address]   Studies/Results: No results found. Medications:  Scheduled Meds: . dicyclomine  20 mg Oral TID AC & HS  . enoxaparin (LOVENOX) injection  30 mg Subcutaneous Q24H  . polyethylene glycol-electrolytes  4,000 mL Oral Once   Continuous Infusions: .  sodium chloride 75 mL/hr at 01/31/20 1106  . [START ON 02/01/2020] cefTRIAXone (ROCEPHIN)  IV    . methocarbamol (ROBAXIN) IV 500 mg (01/31/20 1232)  . metronidazole 500 mg (01/31/20 1113)   PRN Meds:.acetaminophen **OR** acetaminophen, HYDROmorphone (DILAUDID) injection, influenza vac split quadrivalent PF, loperamide, methocarbamol (ROBAXIN) IV, ondansetron (ZOFRAN) IV, oxyCODONE   Assessment: Principal Problem:   Sepsis (HCC) Active Problems:   Enterocolitis   Hyponatremia   Hypokalemia    Plan: Patient continues to complain of abdominal pain despite day 3 of antibiotics and improvement in diarrhea  therefore, would recommend colonoscopy tomorrow. Colonoscopy prep ordered for today  N.p.o. past midnight in anticipation of procedure tomorrow  Continue to correct electrolyte abnormalities to medically optimize the patient for procedure tomorrow   LOS: 2 days   Kathryn Bouillon, MD 01/31/2020, 5:14 PM

## 2020-01-31 NOTE — Progress Notes (Signed)
PROGRESS NOTE    Kathryn Payne  BJY:[id number] DOB: [birth year] DOA: 01/28/2020 PCP: Patient, No Pcp Per   Chief Complain: Abdominal pain, diarrhea  Brief Narrative:  Patient is a 19 year old female with no significant past medical history who presents to the emergency department with complaints of acute abdominal pain, diarrhea.  She reported noticing 5 episodes of black-colored stool at home.  Had fever of 103 at home.  No history of inflammatory bowel disease.  CT abdomen/pelvis showed enterocolitis .  Started on antibiotics and IV fluids.  GI consulted  with suspicion of inflammatory bowel disease.  Assessment & Plan:   Principal Problem:   Sepsis (HCC) Active Problems:   Enterocolitis   Hyponatremia   Hypokalemia   Sepsis secondary to enterocolitis: Febrile, tachycardic and tachypneic, leukocytosis on admission.  Complained of severe abdominal pain and was having diarrhea on presentation.  Started on antibiotics, pain medications. Negative  GI pathogen panel and Cdiff.  No history of inflammatory bowel disease.CT abdomen/pelvis showed enterocolitis. Her sepsis physiology has been improving and this morning she was not tachycardic or febrile.  Leukocytosis is improving.  We will continue current antibiotics.  Follow-up blood cultures,NGTD. Her  nausea and diarrhea this morning have improved .  But she is still complaining of abdominal discomfort.  I added some muscle relaxants.  She wants to stay with clear liquid diet today and will let us know later if she wants to advance her diet. GI is following.  Severe hypokalemia /hypomagnesemia: Potassium was in the range of 2.Magnesium also low.Supplemented and corrected. We will continue to monitor.         DVT prophylaxis:Lovenox Code Status: Full Family Communication: Discussed with mother on phone 10.12.21 Status YQ:MVHQIONGEis:inpatient   Dispo: The patient is from: Home              Anticipated d/c is to: Home               Anticipated d/c date is:1-2 day              Patient currently is not medically stable to d/c.  Still complains of abdominal discomfort, diet not advanced yet.   Consultants: None  Procedures:None  Antimicrobials:  Anti-infectives (From admission, onward)   Start     Dose/Rate Route Frequency Ordered Stop   01/29/20 0400  metroNIDAZOLE (FLAGYL) IVPB 500 mg        500 mg 100 mL/hr over 60 Minutes Intravenous Every 8 hours 01/28/20 2328     01/29/20 0300  cefTRIAXone (ROCEPHIN) 2 g in sodium chloride 0.9 % 100 mL IVPB        2 g 200 mL/hr over 30 Minutes Intravenous Every 24 hours 01/28/20 2328     01/28/20 2230  piperacillin-tazobactam (ZOSYN) IVPB 3.375 g        3.375 g 100 mL/hr over 30 Minutes Intravenous  Once 01/28/20 2219 01/29/20 0338      Subjective:  Patient seen and examined at the bedside this morning.  Hemodynamically stable.  Blood pressure is stable, not tachycardic like when she presented.  Her diarrhea has slowed down.  She had 2 small bowel movements yesterday.  Her nausea and vomiting have improved.  But she is still complaining of some abdominal distention and fullness.  She complains of pain all over her body.  I talked to the mom and answered all her questions.  Objective: Vitals:   01/30/20 1622 01/30/20 1952 01/31/20 0356 01/31/20 0825  BP: 103/65 115/68 126/72 125/84  Pulse: (!) 58 84 74 (!) 101  Resp: 17 15 16 20   Temp: 98.4 F (36.9 C) 99.3 F (37.4 C) 99.2 F (37.3 C) 98.4 F (36.9 C)  TempSrc: Oral Oral Oral Oral  SpO2: 100% 100% 98% 98%  Weight:   47.5 kg   Height:       No intake or output data in the 24 hours ending 01/31/20 0835 Filed Weights   01/29/20 1644 01/30/20 0432 01/31/20 0356  Weight: 45.9 kg 45 kg 47.5 kg    Examination:  General exam: Not in apparent distress HEENT:PERRL,Oral mucosa moist, Ear/Nose normal on gross exam Respiratory system: Bilateral equal air entry, normal vesicular breath sounds, no wheezes or crackles    Cardiovascular system: S1 & S2 heard, RRR. No JVD, murmurs, rubs, gallops or clicks. Gastrointestinal system: Abdomen is mildly distended and nonspecific generalized tenderness. No organomegaly or masses felt. Normal bowel sounds heard. Central nervous system: Alert and oriented. No focal neurological deficits. Extremities: No edema, no clubbing ,no cyanosis, distal peripheral pulses palpable. Skin: No rashes, lesions or ulcers,no icterus ,no pallor    Data Reviewed: I have personally reviewed following labs and imaging studies  CBC: Recent Labs  Lab 01/28/20 1849 01/29/20 0421 01/30/20 0523 01/31/20 0517  WBC 23.0* 21.3* 15.2* 10.8*  NEUTROABS 21.1*  --  13.9* 9.0*  HGB 13.0 10.6* 10.6* 10.0*  HCT 37.9 31.1* 30.5* 29.0*  MCV 94.5 94.2 93.8 93.9  PLT 274 240 215 235   Basic Metabolic Panel: Recent Labs  Lab 01/28/20 1849 01/29/20 0421 01/29/20 1640 01/30/20 0523 01/31/20 0517  NA 134* 132*  --  135 135  K 3.0* 2.5* 3.8 3.5 3.3*  CL 101 103  --  106 104  CO2 22 17*  --  20* 23  GLUCOSE 109* 84  --  87 85  BUN 11 8  --  7 9  CREATININE 0.90 0.88  --  0.55 0.60  CALCIUM 8.4* 7.1*  --  7.3* 7.4*  MG  --  1.4*  --  2.2  --    GFR: Estimated Creatinine Clearance: 80.6 mL/min (by C-G formula based on SCr of 0.6 mg/dL). Liver Function Tests: Recent Labs  Lab 01/28/20 1849  AST 16  ALT 10  ALKPHOS 37*  BILITOT 1.2  PROT 7.9  ALBUMIN 4.1   No results for input(s): LIPASE, AMYLASE in the last 168 hours. No results for input(s): AMMONIA in the last 168 hours. Coagulation Profile: No results for input(s): INR, PROTIME in the last 168 hours. Cardiac Enzymes: No results for input(s): CKTOTAL, CKMB, CKMBINDEX, TROPONINI in the last 168 hours. BNP (last 3 results) No results for input(s): PROBNP in the last 8760 hours. HbA1C: No results for input(s): HGBA1C in the last 72 hours. CBG: No results for input(s): GLUCAP in the last 168 hours. Lipid Profile: No results  for input(s): CHOL, HDL, LDLCALC, TRIG, CHOLHDL, LDLDIRECT in the last 72 hours. Thyroid Function Tests: No results for input(s): TSH, T4TOTAL, FREET4, T3FREE, THYROIDAB in the last 72 hours. Anemia Panel: No results for input(s): VITAMINB12, FOLATE, FERRITIN, TIBC, IRON, RETICCTPCT in the last 72 hours. Sepsis Labs: No results for input(s): PROCALCITON, LATICACIDVEN in the last 168 hours.  Recent Results (from the past 240 hour(s))  Respiratory Panel by RT PCR (Flu A&B, Covid) -     Status: None   Collection Time: 01/28/20  7:53 PM   Specimen: Nasopharyngeal  Result Value Ref Range Status   SARS Coronavirus 2 by RT  PCR NEGATIVE NEGATIVE Final    Comment: (NOTE) SARS-CoV-2 target nucleic acids are NOT DETECTED.  The SARS-CoV-2 RNA is generally detectable in upper respiratoy specimens during the acute phase of infection. The lowest concentration of SARS-CoV-2 viral copies this assay can detect is 131 copies/mL. A negative result does not preclude SARS-Cov-2 infection and should not be used as the sole basis for treatment or other patient management decisions. A negative result may occur with  improper specimen collection/handling, submission of specimen other than nasopharyngeal swab, presence of viral mutation(s) within the areas targeted by this assay, and inadequate number of viral copies (<131 copies/mL). A negative result must be combined with clinical observations, patient history, and epidemiological information. The expected result is Negative.  Fact Sheet for Patients:  https://www.moore.com/  Fact Sheet for Healthcare Providers:  https://www.young.biz/  This test is no t yet approved or cleared by the [place] FDA and  has been authorized for detection and/or diagnosis of SARS-CoV-2 by FDA under an Emergency Use Authorization (EUA). This EUA will remain  in effect (meaning this test can be used) for the duration of  the COVID-19 declaration under Section 564(b)(1) of the Act, 21 U.S.C. section 360bbb-3(b)(1), unless the authorization is terminated or revoked sooner.     Influenza A by PCR NEGATIVE NEGATIVE Final   Influenza B by PCR NEGATIVE NEGATIVE Final    Comment: (NOTE) The Xpert Xpress SARS-CoV-2/FLU/RSV assay is intended as an aid in  the diagnosis of influenza from Nasopharyngeal swab specimens and  should not be used as a sole basis for treatment. Nasal washings and  aspirates are unacceptable for Xpert Xpress SARS-CoV-2/FLU/RSV  testing.  Fact Sheet for Patients: https://www.moore.com/  Fact Sheet for Healthcare Providers: https://www.young.biz/  This test is not yet approved or cleared by the [place] FDA and  has been authorized for detection and/or diagnosis of SARS-CoV-2 by  FDA under an Emergency Use Authorization (EUA). This EUA will remain  in effect (meaning this test can be used) for the duration of the  Covid-19 declaration under Section 564(b)(1) of the Act, 21  U.S.C. section 360bbb-3(b)(1), unless the authorization is  terminated or revoked. Performed at [facility], [address], [place], [place] [address]   Urine Culture     Status: Abnormal   Collection Time: 01/28/20  8:00 PM   Specimen: Urine, Random  Result Value Ref Range Status   Specimen Description   Final    URINE, RANDOM Performed at [facility], [address], [place], [place] [address]    Special Requests   Final    NONE Performed at [facility], [address] [place], [place] [address]    Culture (A)  Final    <10,000 COLONIES/mL INSIGNIFICANT GROWTH Performed at [facility], [address], [place], [place] [address]    Report Status 01/30/2020 FINAL  Final  Gastrointestinal Panel by PCR , Stool     Status: None   Collection Time: 01/29/20 10:06 AM   Specimen: Stool  Result Value Ref Range Status    Campylobacter species NOT DETECTED NOT DETECTED Final   Plesimonas shigelloides NOT DETECTED NOT DETECTED Final   Salmonella species NOT DETECTED NOT DETECTED Final   Yersinia enterocolitica NOT DETECTED NOT DETECTED Final   Vibrio species NOT DETECTED NOT DETECTED Final   Vibrio cholerae NOT DETECTED NOT DETECTED Final   Enteroaggregative E coli (EAEC) NOT DETECTED NOT DETECTED Final   Enteropathogenic E coli (EPEC) NOT DETECTED  NOT DETECTED Final   Enterotoxigenic E coli (ETEC) NOT DETECTED NOT DETECTED Final   Shiga like toxin producing E coli (STEC) NOT DETECTED NOT DETECTED Final   Shigella/Enteroinvasive E coli (EIEC) NOT DETECTED NOT DETECTED Final   Cryptosporidium NOT DETECTED NOT DETECTED Final   Cyclospora cayetanensis NOT DETECTED NOT DETECTED Final   Entamoeba histolytica NOT DETECTED NOT DETECTED Final   Giardia lamblia NOT DETECTED NOT DETECTED Final   Adenovirus F40/41 NOT DETECTED NOT DETECTED Final   Astrovirus NOT DETECTED NOT DETECTED Final   Norovirus GI/GII NOT DETECTED NOT DETECTED Final   Rotavirus A NOT DETECTED NOT DETECTED Final   Sapovirus (I, II, IV, and V) NOT DETECTED NOT DETECTED Final    Comment: Performed at [facility], [address] [place], [place] [address]  CULTURE, BLOOD (ROUTINE X 2) w Reflex to ID Panel     Status: None (Preliminary result)   Collection Time: 01/29/20 10:06 AM   Specimen: BLOOD  Result Value Ref Range Status   Specimen Description BLOOD RIGHT ANTECUBITAL  Final   Special Requests   Final    BOTTLES DRAWN AEROBIC AND ANAEROBIC Blood Culture results may not be optimal due to an excessive volume of blood received in culture bottles   Culture   Final    NO GROWTH 2 DAYS Performed at [facility], [address], [place], [place] [address]    Report Status PENDING  Incomplete  CULTURE, BLOOD (ROUTINE X 2) w Reflex to ID Panel     Status: None (Preliminary result)   Collection Time: 01/29/20 10:06 AM    Specimen: BLOOD  Result Value Ref Range Status   Specimen Description BLOOD RIGHT ANTECUBITAL  Final   Special Requests   Final    BOTTLES DRAWN AEROBIC AND ANAEROBIC Blood Culture results may not be optimal due to an excessive volume of blood received in culture bottles   Culture   Final    NO GROWTH 2 DAYS Performed at [facility], [address] [place], [place] [address]    Report Status PENDING  Incomplete  C Difficile Quick Screen w PCR reflex     Status: None   Collection Time: 01/29/20 10:06 AM   Specimen: STOOL  Result Value Ref Range Status   C Diff antigen NEGATIVE NEGATIVE Final   C Diff toxin NEGATIVE NEGATIVE Final   C Diff interpretation No C. difficile detected.  Final    Comment: Performed at [facility], [address], [place], [place] [address]         Radiology Studies: CT ABDOMEN PELVIS W CONTRAST  Result Date: 01/29/2020 CLINICAL DATA:  Abdominal distension. Persistent pain. Follow-up exam. EXAM: CT ABDOMEN AND PELVIS WITH CONTRAST TECHNIQUE: Multidetector CT imaging of the abdomen and pelvis was performed using the standard protocol following bolus administration of intravenous contrast. CONTRAST:  69mL OMNIPAQUE IOHEXOL 300 MG/ML  SOLN COMPARISON:  CT abdomen pelvis 01/28/2020 FINDINGS: Lower chest: Normal heart size. Subpleural ground-glass and consolidative opacities within the lower lobes bilaterally favored to represent atelectasis. Hepatobiliary: Liver is normal in size and contour. Gallbladder is unremarkable. Pancreas: Unremarkable Spleen: Unremarkable Adrenals/Urinary Tract: Normal adrenal glands. Kidneys enhance symmetrically with contrast. No hydronephrosis. Contrast/dense material within the urinary bladder. Stomach/Bowel: Fluid throughout the colon. No evidence for small bowel distension there is mild wall thickening of the distal ileum. Few scattered mildly thickened loops of small bowel within the central abdomen. There is wall  thickening and small amount of  fluid about the cecum and ascending colon. Normal morphology of the stomach and proximal duodenum. No free intraperitoneal air. Vascular/Lymphatic: Normal caliber abdominal aorta. No retroperitoneal lymphadenopathy. Reproductive: Uterus and adnexal structures are unremarkable. Other: None. Musculoskeletal: No aggressive or acute appearing osseous lesions. IMPRESSION: 1. Fluid throughout the colon as can be seen with diarrhea. 2. There is wall thickening and small amount of fluid about the cecum and ascending colon. Additionally, there are a few mildly thick walled loops of small bowel within the central abdomen. Findings are nonspecific however may be secondary to an infectious or inflammatory process. 3. Small amount of fluid within the lower abdomen. 4. Subpleural ground-glass and consolidative opacities within the lower lobes bilaterally favored to represent atelectasis. Electronically Signed   By: Annia Belt M.D.   On: 01/29/2020 14:15        Scheduled Meds: . dicyclomine  20 mg Oral TID AC & HS  . enoxaparin (LOVENOX) injection  30 mg Subcutaneous Q24H  . potassium chloride  40 mEq Oral Once   Continuous Infusions: . sodium chloride 100 mL/hr at 01/30/20 2050  . cefTRIAXone (ROCEPHIN)  IV 2 g (01/31/20 0358)  . metronidazole 500 mg (01/31/20 0451)     LOS: 2 days    Time spent: 25 mins.More than 50% of that time was spent in counseling and/or coordination of care.      Burnadette Pop, MD [facility] P10/03/2020, 8:35 AM

## 2020-02-01 ENCOUNTER — Inpatient Hospital Stay: Payer: Self-pay | Admitting: Anesthesiology

## 2020-02-01 ENCOUNTER — Encounter: Payer: Self-pay | Admitting: Internal Medicine

## 2020-02-01 ENCOUNTER — Encounter
Admission: EM | Disposition: A | Discharge status: Home / Self Care | Payer: Self-pay | Source: Home / Self Care | Attending: Internal Medicine

## 2020-02-01 DIAGNOSIS — R109 Unspecified abdominal pain: Secondary | ICD-10-CM

## 2020-02-01 DIAGNOSIS — R197 Diarrhea, unspecified: Secondary | ICD-10-CM

## 2020-02-01 HISTORY — PX: COLONOSCOPY: SHX5424

## 2020-02-01 LAB — BASIC METABOLIC PANEL
Anion gap: 8 (ref 5–15)
BUN: <5 mg/dL — ABNORMAL LOW (ref 6–20)
CO2: 25 mmol/L (ref 22–32)
Calcium: 7.7 mg/dL — ABNORMAL LOW (ref 8.9–10.3)
Chloride: 103 mmol/L (ref 98–111)
Creatinine, Ser: 0.57 mg/dL (ref 0.44–1.00)
GFR, Estimated: >60 mL/min (ref >60)
Glucose, Bld: 101 mg/dL — ABNORMAL HIGH (ref 70–99)
Potassium: 3.2 mmol/L — ABNORMAL LOW (ref 3.5–5.1)
Sodium: 136 mmol/L (ref 135–145)

## 2020-02-01 LAB — CBC WITH DIFFERENTIAL/PLATELET
Abs Immature Granulocytes: 0.09 10*3/uL — ABNORMAL HIGH (ref 0.00–0.07)
Basophils Absolute: 0 10*3/uL (ref 0.0–0.1)
Basophils Relative: 0 %
Eosinophils Absolute: 0 10*3/uL (ref 0.0–0.5)
Eosinophils Relative: 0 %
HCT: 29.3 % — ABNORMAL LOW (ref 36.0–46.0)
Hemoglobin: 10.2 g/dL — ABNORMAL LOW (ref 12.0–15.0)
Immature Granulocytes: 1 %
Lymphocytes Relative: 9 %
Lymphs Abs: 0.8 10*3/uL (ref 0.7–4.0)
MCH: 32.6 pg (ref 26.0–34.0)
MCHC: 34.8 g/dL (ref 30.0–36.0)
MCV: 93.6 fL (ref 80.0–100.0)
Monocytes Absolute: 1.1 10*3/uL — ABNORMAL HIGH (ref 0.1–1.0)
Monocytes Relative: 12 %
Neutro Abs: 7.4 10*3/uL (ref 1.7–7.7)
Neutrophils Relative %: 78 %
Platelets: 308 10*3/uL (ref 150–400)
RBC: 3.13 MIL/uL — ABNORMAL LOW (ref 3.87–5.11)
RDW: 12.8 % (ref 11.5–15.5)
WBC: 9.5 10*3/uL (ref 4.0–10.5)
nRBC: 0 % (ref 0.0–0.2)

## 2020-02-01 SURGERY — COLONOSCOPY
Anesthesia: General

## 2020-02-01 MED ORDER — FENTANYL CITRATE (PF) 100 MCG/2ML IJ SOLN
INTRAMUSCULAR | Status: AC
  Filled 2020-02-01: qty 2

## 2020-02-01 MED ORDER — POTASSIUM CHLORIDE 10 MEQ/100ML IV SOLN
10.0000 meq | INTRAVENOUS | Status: DC
  Filled 2020-02-01 (×3): qty 100

## 2020-02-01 MED ORDER — PROPOFOL 10 MG/ML IV BOLUS
INTRAVENOUS | Status: AC
  Filled 2020-02-01: qty 20

## 2020-02-01 MED ORDER — POLYETHYLENE GLYCOL 3350 17 GM/SCOOP PO POWD
1.0000 | Freq: Once | ORAL | Status: AC
  Administered 2020-02-01: 255 g via ORAL
  Filled 2020-02-01: qty 255

## 2020-02-01 MED ORDER — CYCLOBENZAPRINE HCL 10 MG PO TABS: 10.0000 mg | ORAL_TABLET | Freq: Three times a day (TID) | ORAL | 0 refills | Status: AC | PRN

## 2020-02-01 MED ORDER — LIDOCAINE HCL (CARDIAC) PF 100 MG/5ML IV SOSY
PREFILLED_SYRINGE | INTRAVENOUS | Status: DC | PRN
  Administered 2020-02-01: 80 mg via INTRAVENOUS

## 2020-02-01 MED ORDER — PROPOFOL 500 MG/50ML IV EMUL
INTRAVENOUS | Status: AC
  Filled 2020-02-01: qty 50

## 2020-02-01 MED ORDER — INFLUENZA VAC SPLIT QUAD 0.5 ML IM SUSY: 0.5000 mL | PREFILLED_SYRINGE | Freq: Once | INTRAMUSCULAR | 0 refills | Status: AC

## 2020-02-01 MED ORDER — PROPOFOL 10 MG/ML IV BOLUS
INTRAVENOUS | Status: DC | PRN
  Administered 2020-02-01: 100 ug/kg/min via INTRAVENOUS
  Administered 2020-02-01: 100 mg via INTRAVENOUS

## 2020-02-01 MED ORDER — NAPROXEN 500 MG PO TBEC: 500.0000 mg | DELAYED_RELEASE_TABLET | Freq: Two times a day (BID) | ORAL | 0 refills | Status: AC

## 2020-02-01 MED ORDER — AMOXICILLIN-POT CLAVULANATE 875-125 MG PO TABS: 1.0000 | ORAL_TABLET | Freq: Two times a day (BID) | ORAL | 0 refills | Status: AC

## 2020-02-01 MED ORDER — SODIUM CHLORIDE 0.9 % IV SOLN: INTRAVENOUS | Status: DC

## 2020-02-01 MED ORDER — POTASSIUM CHLORIDE CRYS ER 20 MEQ PO TBCR
40.0000 meq | EXTENDED_RELEASE_TABLET | Freq: Once | ORAL | Status: AC
  Administered 2020-02-01: 40 meq via ORAL
  Filled 2020-02-01: qty 2

## 2020-02-01 MED ORDER — OXYCODONE HCL 5 MG PO TABS
5.0000 mg | ORAL_TABLET | ORAL | Status: DC | PRN
  Administered 2020-02-01: 5 mg via ORAL
  Filled 2020-02-01: qty 1

## 2020-02-01 MED ORDER — FENTANYL CITRATE (PF) 100 MCG/2ML IJ SOLN
INTRAMUSCULAR | Status: DC | PRN
  Administered 2020-02-01 (×2): 50 ug via INTRAVENOUS

## 2020-02-01 NOTE — Op Note (Signed)
[facility] Gastroenterology Patient Name: Kathryn Payne Procedure Date: 02/01/2020 12:07 PM MRN: [id number] Account #: [id number] Date of Birth: 2000-10-20 Admit Type: Outpatient Age: 19 Room: [facility] ENDO ROOM 1 Gender: Female Note Status: Finalized Procedure:             Colonoscopy Indications:           Abdominal pain, Diarrhea Providers:             Tekoa Amon B. Bonna Gains MD, MD Medicines:             Monitored Anesthesia Care Complications:         No immediate complications. Procedure:             Pre-Anesthesia Assessment:                        - Prior to the procedure, a History and Physical was                         performed, and patient medications, allergies and                         sensitivities were reviewed. The patient's tolerance                         of previous anesthesia was reviewed.                        - The risks and benefits of the procedure and the                         sedation options and risks were discussed with the                         patient. All questions were answered and informed                         consent was obtained.                        - Patient identification and proposed procedure were                         verified prior to the procedure by the physician, the                         nurse, the anesthesiologist, the anesthetist and the                         technician. The procedure was verified in the                         pre-procedure area in the procedure room in the                         endoscopy suite.                        - Prophylactic Antibiotics: The patient does not  require prophylactic antibiotics.                        - ASA Grade Assessment: II - A patient with mild                         systemic disease.                        - After reviewing the risks and benefits, the patient                         was deemed in satisfactory condition to  undergo the                         procedure.                        - Monitored anesthesia care was determined to be                         medically necessary for this procedure based on review                         of the patient's medical history, medications, and                         prior anesthesia history.                        - The anesthesia plan was to use monitored anesthesia                         care (MAC).                        After obtaining informed consent, the colonoscope was                         passed under direct vision. Throughout the procedure,                         the patient's blood pressure, pulse, and oxygen                         saturations were monitored continuously. The                         Colonoscope was introduced through the anus and                         advanced to the the cecum, identified by appendiceal                         orifice and ileocecal valve. The colonoscopy was                         performed with ease. The patient tolerated the  procedure well. The quality of the bowel preparation                         was poor. Findings:      The perianal and digital rectal examinations were normal.      The terminal ileum appeared normal. Biopsies were taken with a cold       forceps for histology.      Normal mucosa was found in the entire colon. Biopsies were taken with a       cold forceps for histology.      The retroflexed view of the distal rectum and anal verge was normal and       showed no anal or rectal abnormalities. Impression:            - Preparation of the colon was poor.                        - The examined portion of the ileum was normal.                         Biopsied.                        - Normal mucosa in the entire examined colon. Biopsied.                        - The distal rectum and anal verge are normal on                         retroflexion  view. Recommendation:        - Await pathology results.                        - Patient's presentation was likely due to infectious                         etiology. No evidence of IBD on today's exam                        - Return patient to hospital ward for ongoing care.                        - The findings and recommendations were discussed with                         the patient. Procedure Code(s):     --- Professional ---                        [phone], Colonoscopy, flexible; with biopsy, single or                         multiple Diagnosis Code(s):     --- Professional ---                        R10.9, Unspecified abdominal pain                        R19.7, Diarrhea, unspecified CPT copyright 2019 American Medical Association. All rights reserved. The codes  documented in this report are preliminary and upon coder review may  be revised to meet current compliance requirements.  Vonda Antigua, MD Margretta Sidle B. Bonna Gains MD, MD 02/01/2020 38:32:91 PM This report has been signed electronically. Number of Addenda: 0 Note Initiated On: 02/01/2020 12:07 PM Scope Withdrawal Time: 0 hours 8 minutes 19 seconds  Total Procedure Duration: 0 hours 10 minutes 38 seconds  Estimated Blood Loss:  Estimated blood loss: none.      [facility]

## 2020-02-01 NOTE — Anesthesia Postprocedure Evaluation (Signed)
Anesthesia Post Note  Patient: Kathryn Payne  Procedure(s) Performed: COLONOSCOPY (N/A )  Patient location during evaluation: Endoscopy Anesthesia Type: General Level of consciousness: awake and alert Pain management: pain level controlled Vital Signs Assessment: post-procedure vital signs reviewed and stable Respiratory status: spontaneous breathing and respiratory function stable Cardiovascular status: stable Anesthetic complications: no   No complications documented.   Last Vitals:  Vitals:   02/01/20 1257 02/01/20 1307  BP: (!) 111/91 123/77  Pulse: 88 (!) 33  Resp: (!) 22 (!) 33  Temp:    SpO2: 99% 99%    Last Pain:  Vitals:   02/01/20 1307  TempSrc:   PainSc: 0-No pain                 Kinneth Fujiwara K

## 2020-02-01 NOTE — Anesthesia Preprocedure Evaluation (Signed)
Anesthesia Evaluation  Patient identified by MRN, date of birth, ID band Patient awake    Reviewed: Allergy & Precautions, NPO status , Patient's Chart, lab work & pertinent test results  History of Anesthesia Complications Negative for: history of anesthetic complications  Airway Mallampati: II       Dental   Pulmonary neg sleep apnea, neg COPD, Not current smoker,           Cardiovascular (-) hypertension(-) Past MI and (-) CHF (-) dysrhythmias (-) Valvular Problems/Murmurs     Neuro/Psych neg Seizures (? seizure as a child, no meds, none since)    GI/Hepatic Neg liver ROS, neg GERD  ,  Endo/Other  neg diabetes  Renal/GU negative Renal ROS     Musculoskeletal   Abdominal   Peds  Hematology   Anesthesia Other Findings   Reproductive/Obstetrics                             Anesthesia Physical Anesthesia Plan  ASA: II  Anesthesia Plan: General   Post-op Pain Management:    Induction: Intravenous  PONV Risk Score and Plan: 3 and Propofol infusion, TIVA and Treatment may vary due to age or medical condition  Airway Management Planned: Nasal Cannula  Additional Equipment:   Intra-op Plan:   Post-operative Plan:   Informed Consent: I have reviewed the patients History and Physical, chart, labs and discussed the procedure including the risks, benefits and alternatives for the proposed anesthesia with the patient or authorized representative who has indicated his/her understanding and acceptance.       Plan Discussed with:   Anesthesia Plan Comments:         Anesthesia Quick Evaluation

## 2020-02-01 NOTE — Progress Notes (Signed)
Mobility Specialist - Progress Note   02/01/20 1600  Mobility  Activity Ambulated in room  Range of Motion/Exercises Right leg;Left leg (hip add/abd, slr, heel slides)  Level of Assistance Independent  Assistive Device None  Distance Ambulated (ft) 15 ft  Mobility Response Tolerated well  Mobility performed by Mobility specialist  $Mobility charge 1 Mobility    Pre-mobility: 89 HR, 97% SpO2 Post-mobility: 83 HR, 95% SpO2   Pt was lying in bed upon arrival. Pt agreed to session. Pt c/o pain in R side of abdomen, rating it a 9/10. Pt stated that her "pain increases when I'm walking or I feel like I have to pee, stuff like that." Pt also described this pain as what feels like a "bruised rib", stating that "sometimes it hurts even when I take a deep breath." Pt initially declined OOB activity, and was able to perform supine exercises: straight leg raises, hip add/abd, and heel slides independently. However, pt did agree to ambulate 1 lap around the room stating "I think me being in bed all day makes it worse too". Pt was able to get EOB and stand without physical assistance. Pt ambulated 15' in room with no LOB. Pt returned to bed in supine position and c/o pain now being a "10/10". Overall, pt tolerated session well. Pt was left in bed with all needs in reach. Nurse was notified.    Filiberto Pinks Mobility Specialist 02/01/20, 5:07 PM

## 2020-02-01 NOTE — Progress Notes (Signed)
Melodie Bouillon, MD [address], [address], [place], [place], [address] [address], [address], [place], [place], [address] Phone: [phone]  Fax: [phone]   Subjective: Pt continues to report abdominal pain today. Reports low appetite. No blood in stool.    Objective: Exam: Vital signs in last 24 hours: Vitals:   01/31/20 1924 02/01/20 0434 02/01/20 0805 02/01/20 1052  BP: 122/80 118/78 120/79 118/84  Pulse: 98 72 96 97  Resp: 18 18 18 20   Temp: 99.6 F (37.6 C) 98.3 F (36.8 C) 99 F (37.2 C) 98.2 F (36.8 C)  TempSrc: Oral Oral Oral Temporal  SpO2: 97% 98% 99% 96%  Weight:      Height:       Weight change:   Intake/Output Summary (Last 24 hours) at 02/01/2020 1214 Last data filed at 01/31/2020 1700 Gross per 24 hour  Intake --  Output 200 ml  Net -200 ml    General: No acute distress, AAO x3 Abd: Soft, NT/ND, No HSM Skin: Warm, no rashes Neck: Supple, Trachea midline   Lab Results: Lab Results  Component Value Date   WBC 9.5 02/01/2020   HGB 10.2 (L) 02/01/2020   HCT 29.3 (L) 02/01/2020   MCV 93.6 02/01/2020   PLT 308 02/01/2020   Micro Results: Recent Results (from the past 240 hour(s))  Respiratory Panel by RT PCR (Flu A&B, Covid) -     Status: None   Collection Time: 01/28/20  7:53 PM   Specimen: Nasopharyngeal  Result Value Ref Range Status   SARS Coronavirus 2 by RT PCR NEGATIVE NEGATIVE Final    Comment: (NOTE) SARS-CoV-2 target nucleic acids are NOT DETECTED.  The SARS-CoV-2 RNA is generally detectable in upper respiratoy specimens during the acute phase of infection. The lowest concentration of SARS-CoV-2 viral copies this assay can detect is 131 copies/mL. A negative result does not preclude SARS-Cov-2 infection and should not be used as the sole basis for treatment or other patient management decisions. A negative result may occur with  improper specimen collection/handling, submission of specimen other than  nasopharyngeal swab, presence of viral mutation(s) within the areas targeted by this assay, and inadequate number of viral copies (<131 copies/mL). A negative result must be combined with clinical observations, patient history, and epidemiological information. The expected result is Negative.  Fact Sheet for Patients:  03/29/20  Fact Sheet for Healthcare Providers:  https://www.moore.com/  This test is no t yet approved or cleared by the https://www.young.biz/ FDA and  has been authorized for detection and/or diagnosis of SARS-CoV-2 by FDA under an Emergency Use Authorization (EUA). This EUA will remain  in effect (meaning this test can be used) for the duration of the COVID-19 declaration under Section 564(b)(1) of the Act, 21 U.S.C. section 360bbb-3(b)(1), unless the authorization is terminated or revoked sooner.     Influenza A by PCR NEGATIVE NEGATIVE Final   Influenza B by PCR NEGATIVE NEGATIVE Final    Comment: (NOTE) The Xpert Xpress SARS-CoV-2/FLU/RSV assay is intended as an aid in  the diagnosis of influenza from Nasopharyngeal swab specimens and  should not be used as a sole basis for treatment. Nasal washings and  aspirates are unacceptable for Xpert Xpress SARS-CoV-2/FLU/RSV  testing.  Fact Sheet for Patients: [place]  Fact Sheet for Healthcare Providers: https://www.moore.com/  This test is not yet approved or cleared by the https://www.young.biz/ FDA and  has been authorized for detection and/or diagnosis of SARS-CoV-2 by  FDA under an Emergency Use  Authorization (EUA). This EUA will remain  in effect (meaning this test can be used) for the duration of the  Covid-19 declaration under Section 564(b)(1) of the Act, 21  U.S.C. section 360bbb-3(b)(1), unless the authorization is  terminated or revoked. Performed at [facility], [address] [place], [place]  [address]   Urine Culture     Status: Abnormal   Collection Time: 01/28/20  8:00 PM   Specimen: Urine, Random  Result Value Ref Range Status   Specimen Description   Final    URINE, RANDOM Performed at [facility], [address], [place], [place] [address]    Special Requests   Final    NONE Performed at [facility], [address] [place], [place] [address]    Culture (A)  Final    <10,000 COLONIES/mL INSIGNIFICANT GROWTH Performed at [facility], [address] [place], [place] [address]    Report Status 01/30/2020 FINAL  Final  Gastrointestinal Panel by PCR , Stool     Status: None   Collection Time: 01/29/20 10:06 AM   Specimen: Stool  Result Value Ref Range Status   Campylobacter species NOT DETECTED NOT DETECTED Final   Plesimonas shigelloides NOT DETECTED NOT DETECTED Final   Salmonella species NOT DETECTED NOT DETECTED Final   Yersinia enterocolitica NOT DETECTED NOT DETECTED Final   Vibrio species NOT DETECTED NOT DETECTED Final   Vibrio cholerae NOT DETECTED NOT DETECTED Final   Enteroaggregative E coli (EAEC) NOT DETECTED NOT DETECTED Final   Enteropathogenic E coli (EPEC) NOT DETECTED NOT DETECTED Final   Enterotoxigenic E coli (ETEC) NOT DETECTED NOT DETECTED Final   Shiga like toxin producing E coli (STEC) NOT DETECTED NOT DETECTED Final   Shigella/Enteroinvasive E coli (EIEC) NOT DETECTED NOT DETECTED Final   Cryptosporidium NOT DETECTED NOT DETECTED Final   Cyclospora cayetanensis NOT DETECTED NOT DETECTED Final   Entamoeba histolytica NOT DETECTED NOT DETECTED Final   Giardia lamblia NOT DETECTED NOT DETECTED Final   Adenovirus F40/41 NOT DETECTED NOT DETECTED Final   Astrovirus NOT DETECTED NOT DETECTED Final   Norovirus GI/GII NOT DETECTED NOT DETECTED Final   Rotavirus A NOT DETECTED NOT DETECTED Final   Sapovirus (I, II, IV, and V) NOT DETECTED NOT DETECTED Final    Comment: Performed at [facility], [address] [place], [place] [address]  CULTURE, BLOOD (ROUTINE X 2) w Reflex to ID Panel     Status: None (Preliminary result)   Collection Time: 01/29/20 10:06 AM   Specimen: BLOOD  Result Value Ref Range Status   Specimen Description BLOOD RIGHT ANTECUBITAL  Final   Special Requests   Final    BOTTLES DRAWN AEROBIC AND ANAEROBIC Blood Culture results may not be optimal due to an excessive volume of blood received in culture bottles   Culture   Final    NO GROWTH 3 DAYS Performed at [facility], [address] [place], [place] [address]    Report Status PENDING  Incomplete  CULTURE, BLOOD (ROUTINE X 2) w Reflex to ID Panel     Status: None (Preliminary result)   Collection Time: 01/29/20 10:06 AM   Specimen: BLOOD  Result Value Ref Range Status   Specimen Description BLOOD RIGHT ANTECUBITAL  Final   Special Requests   Final    BOTTLES DRAWN AEROBIC AND ANAEROBIC Blood Culture results may not be optimal due to an excessive volume of blood received in culture bottles  Culture   Final    NO GROWTH 3 DAYS Performed at [facility], [address] [place], [place] [address]    Report Status PENDING  Incomplete  C Difficile Quick Screen w PCR reflex     Status: None   Collection Time: 01/29/20 10:06 AM   Specimen: STOOL  Result Value Ref Range Status   C Diff antigen NEGATIVE NEGATIVE Final   C Diff toxin NEGATIVE NEGATIVE Final   C Diff interpretation No C. difficile detected.  Final    Comment: Performed at [facility], [address] [place], [place] [address]   Studies/Results: No results found. Medications:  Scheduled Meds: . [MAR Hold] dicyclomine  20 mg Oral TID AC & HS  . [MAR Hold] enoxaparin (LOVENOX) injection  30 mg Subcutaneous Q24H   Continuous Infusions: . sodium chloride 75 mL/hr at 02/01/20 0030  . sodium chloride    . [MAR Hold] cefTRIAXone (ROCEPHIN)  IV 1 g (02/01/20 0821)  . [MAR Hold] methocarbamol (ROBAXIN) IV 500 mg  (01/31/20 1232)  . [MAR Hold] metronidazole 500 mg (02/01/20 0515)  . [MAR Hold] potassium chloride     PRN Meds:.[MAR Hold] acetaminophen **OR** [MAR Hold] acetaminophen, influenza vac split quadrivalent PF, [MAR Hold] loperamide, [MAR Hold] methocarbamol (ROBAXIN) IV, [MAR Hold] ondansetron (ZOFRAN) IV, [MAR Hold] oxyCODONE   Assessment: Principal Problem:   Sepsis (HCC) Active Problems:   Enterocolitis   Hyponatremia   Hypokalemia    Plan: Proceed with colonoscopy today to rule out IBD given ongoing symptoms  I have discussed alternative options, risks & benefits,  which include, but are not limited to, bleeding, infection, perforation,respiratory complication & drug reaction.  The patient agrees with this plan & written consent will be obtained.      LOS: 3 days   Melodie Bouillon, MD 02/01/2020, 12:14 PM

## 2020-02-01 NOTE — Transfer of Care (Signed)
Immediate Anesthesia Transfer of Care Note  Patient: Kathryn Payne  Procedure(s) Performed: COLONOSCOPY (N/A )  Patient Location: PACU  Anesthesia Type:General  Level of Consciousness: awake, alert  and oriented  Airway & Oxygen Therapy: Patient Spontanous Breathing and Patient connected to face mask oxygen  Post-op Assessment: Report given to RN and Post -op Vital signs reviewed and stable  Post vital signs: Reviewed and stable  Last Vitals:  Vitals Value Taken Time  BP 111/91 02/01/20 1257  Temp    Pulse 88 02/01/20 1300  Resp 23 02/01/20 1300  SpO2 100 % 02/01/20 1300  Vitals shown include unvalidated device data.  Last Pain:  Vitals:   02/01/20 1257  TempSrc:   PainSc: 0-No pain      Patients Stated Pain Goal: 0 (01/31/20 0845)  Complications: No complications documented.

## 2020-02-01 NOTE — Progress Notes (Signed)
Kathryn Payne to be D/C'd Home per MD order.  Discussed prescriptions and follow up appointments with the patient. Prescriptions electronically submitted, medication list explained in detail. Pt verbalized understanding.  Allergies as of 02/01/2020   No Known Allergies     Medication List    STOP taking these medications   tobramycin 0.3 % ophthalmic solution Commonly known as: TOBREX     TAKE these medications   amoxicillin-clavulanate 875-125 MG tablet Commonly known as: Augmentin Take 1 tablet by mouth 2 (two) times daily for 5 days. Antibiotic.   cyclobenzaprine 10 MG tablet Commonly known as: FLEXERIL Take 1 tablet (10 mg total) by mouth 3 (three) times daily as needed for up to 7 days for muscle spasms.   influenza vac split quadrivalent PF 0.5 ML injection Commonly known as: FLUARIX Inject 0.5 mLs into the muscle once for 1 dose.   naproxen 500 MG EC tablet Commonly known as: EC NAPROSYN Take 1 tablet (500 mg total) by mouth 2 (two) times daily with a meal for 5 days. For rib pain.       Vitals:   02/01/20 1350 02/01/20 1353  BP:  112/67  Pulse:  85  Resp:  19  Temp: 98.5 F (36.9 C) 98.5 F (36.9 C)  SpO2:  98%    Skin clean, dry and intact without evidence of skin break down, no evidence of skin tears noted. IV catheter discontinued intact. Site without signs and symptoms of complications. Dressing and pressure applied. Pt denies pain at this time. No complaints noted.  An After Visit Summary was printed and given to the patient. Patient waiting on ride to be discharged home  Kathryn Payne M Flower Franko

## 2020-02-01 NOTE — Discharge Summary (Signed)
Physician Discharge Summary   Kathryn Payne  female DOB: 01-27-2001  IRJ:[id number]  PCP: Patient, No Pcp Per  Admit date: 01/28/2020 Discharge date: 02/01/2020  Admitted From: home Disposition:  home Parent updated prior to discharge.  CODE STATUS: Full code  Discharge Instructions    Discharge instructions   Complete by: As directed    Colonoscopy didn't show signs of inflammatory bowel disease, which is good.  You were treated for intestinal infection with 5 days of IV antibiotics and labs showed resolution of infection.  Please continue 5 more days of oral Augmentin as directed at home.  You have pain under your right ribs which could be inflammation or spasms.  I have prescribed a short course of Naproxen for pain and inflammation, and flexeril for muscle relaxant.   Dr. Darlin Priestly [facility] Course:  For full details, please see H&P, progress notes, consult notes and ancillary notes.  Briefly,  Kathryn Payne is a 19 year old female with no significant past medical history who presented to the emergency department with complaints of acute abdominal pain, diarrhea.  She reported 5 episodes of black-colored stool at home.  Had fever of 103 at home.  No history of inflammatory bowel disease.  CT abdomen/pelvis showed enterocolitis .  Started on antibiotics and IV fluids.  GI consulted to assess for inflammatory bowel disease.  Sepsis secondary to enterocolitis:  Febrile, tachycardic and tachypneic, leukocytosis (WBC 23) on admission.  Complained of severe abdominal pain and was having diarrhea on presentation.  Received ceftriaxone and IV flagyl during hospitalization with WBC trending down to 9.5 prior to discharge.  GI pathogen panel and C diff neg.  Colonoscopy didn't show signs of inflammatory bowel disease.  Pt was discharged on 5 more days of oral Augmentin.  Severe hypokalemia and hypomagnesemia Repleted PRN.  Right rib pain Reproducible with  palpation.  Likely due to costochondritis or muscle spasms.  Pt was discharged on a short course of Naproxen and muscle relaxant.   Discharge Diagnoses:  Principal Problem:   Sepsis (HCC) Active Problems:   Enterocolitis   Hyponatremia   Hypokalemia   Diarrhea   Abdominal pain    Discharge Instructions:  Allergies as of 02/01/2020   No Known Allergies     Medication List    STOP taking these medications   tobramycin 0.3 % ophthalmic solution Commonly known as: TOBREX     TAKE these medications   amoxicillin-clavulanate 875-125 MG tablet Commonly known as: Augmentin Take 1 tablet by mouth 2 (two) times daily for 5 days. Antibiotic.   cyclobenzaprine 10 MG tablet Commonly known as: FLEXERIL Take 1 tablet (10 mg total) by mouth 3 (three) times daily as needed for up to 7 days for muscle spasms.   influenza vac split quadrivalent PF 0.5 ML injection Commonly known as: FLUARIX Inject 0.5 mLs into the muscle once for 1 dose.   naproxen 500 MG EC tablet Commonly known as: EC NAPROSYN Take 1 tablet (500 mg total) by mouth 2 (two) times daily with a meal for 5 days. For rib pain.        Follow-up Information    Pasty Spillers, MD. Schedule an appointment as soon as possible for a visit in 2 week(s).   Specialty: Gastroenterology Why: follw up for biopsy results Contact information: [address] [place] [address] [phone]               No Known Allergies  The results of significant diagnostics from this hospitalization (including imaging, microbiology, ancillary and laboratory) are listed below for reference.   Consultations:   Procedures/Studies: CT ABDOMEN PELVIS W CONTRAST  Result Date: 01/29/2020 CLINICAL DATA:  Abdominal distension. Persistent pain. Follow-up exam. EXAM: CT ABDOMEN AND PELVIS WITH CONTRAST TECHNIQUE: Multidetector CT imaging of the abdomen and pelvis was performed using the standard protocol following bolus  administration of intravenous contrast. CONTRAST:  26mL OMNIPAQUE IOHEXOL 300 MG/ML  SOLN COMPARISON:  CT abdomen pelvis 01/28/2020 FINDINGS: Lower chest: Normal heart size. Subpleural ground-glass and consolidative opacities within the lower lobes bilaterally favored to represent atelectasis. Hepatobiliary: Liver is normal in size and contour. Gallbladder is unremarkable. Pancreas: Unremarkable Spleen: Unremarkable Adrenals/Urinary Tract: Normal adrenal glands. Kidneys enhance symmetrically with contrast. No hydronephrosis. Contrast/dense material within the urinary bladder. Stomach/Bowel: Fluid throughout the colon. No evidence for small bowel distension there is mild wall thickening of the distal ileum. Few scattered mildly thickened loops of small bowel within the central abdomen. There is wall thickening and small amount of fluid about the cecum and ascending colon. Normal morphology of the stomach and proximal duodenum. No free intraperitoneal air. Vascular/Lymphatic: Normal caliber abdominal aorta. No retroperitoneal lymphadenopathy. Reproductive: Uterus and adnexal structures are unremarkable. Other: None. Musculoskeletal: No aggressive or acute appearing osseous lesions. IMPRESSION: 1. Fluid throughout the colon as can be seen with diarrhea. 2. There is wall thickening and small amount of fluid about the cecum and ascending colon. Additionally, there are a few mildly thick walled loops of small bowel within the central abdomen. Findings are nonspecific however may be secondary to an infectious or inflammatory process. 3. Small amount of fluid within the lower abdomen. 4. Subpleural ground-glass and consolidative opacities within the lower lobes bilaterally favored to represent atelectasis. Electronically Signed   By: Annia Belt M.D.   On: 01/29/2020 14:15   CT ABDOMEN PELVIS W CONTRAST  Result Date: 01/28/2020 CLINICAL DATA:  Right lower rib pain which began yesterday, loose bowel movements yesterday  EXAM: CT ABDOMEN AND PELVIS WITH CONTRAST TECHNIQUE: Multidetector CT imaging of the abdomen and pelvis was performed using the standard protocol following bolus administration of intravenous contrast. CONTRAST:  41mL OMNIPAQUE IOHEXOL 300 MG/ML  SOLN COMPARISON:  None. FINDINGS: Upper abdominal/lower chest imaging with extensive respiratory motion artifact, reimaged with improved acquisition. Lower chest: Lung bases are clear. Normal heart size. No pericardial effusion. Hepatobiliary: No worrisome focal liver lesions. Smooth liver surface contour. Normal hepatic attenuation. Normal gallbladder and biliary tree. Pancreas: Unremarkable. No pancreatic ductal dilatation or surrounding inflammatory changes. Spleen: Normal in size. No concerning splenic lesions. Adrenals/Urinary Tract: Normal adrenal glands. Kidneys are normally located with symmetric enhancement. No suspicious renal lesion, urolithiasis or hydronephrosis. Mild bladder wall thickening, more pronounced towards the bladder dome, possibly reactive. Stomach/Bowel: Distal esophagus and stomach are unremarkable. Duodenum with normal sweep across the midline abdomen. No proximal small bowel thickening or dilatation. However, the ileum, most notably towards the terminal segments appears diffusely thickened and fluid-filled with adjacent hazy stranding of the mesentery and increased vascularity and numerous reactive adenopathy. Similar appearance seen throughout the colon with mild pancolonic mural thickening and mucosal hyperemia. Lack of formed stool suggesting the diarrheal illness/rapid transit state. A normal air-filled appendix is present in the right lower quadrant coursing towards the midline pelvis. No evidence of bowel obstruction. Vascular/Lymphatic: Numerous prominent though nonenlarged mesenteric nodes may be reactive. No pathologically enlarged abdominopelvic adenopathy within the included lymphatic chains. Increased vascularity of the mesentery,  possibly  reactive without other significant vascular findings. Reproductive: Normal anteverted uterus. Likely collapsing/involuting corpus luteum in the left ovary, normal physiologic finding in a reproductive age female. No concerning adnexal lesions. Other: Trace free fluid in the pelvis, possibly reactive or physiologic in a reproductive age female. Mesenteric hazy stranding noted predominantly in the anterior mesentery and omentum of the low anterior abdomen (2/66). No free abdominopelvic air. No bowel containing hernias. Musculoskeletal: No acute osseous abnormality or suspicious osseous lesion. Musculature is normal and symmetric. IMPRESSION: 1. Hyperemic, mildly thickened and fluid-filled distal ileum and colon. Lack of formed stool suggesting the diarrheal illness/rapid transit state. Findings are suggestive of an infectious or inflammatory enterocolitis. 2. Mild bladder wall thickening, more pronounced towards the bladder dome, possibly reactive in a reproductive age female. Recommend correlation for urinary symptoms and consideration of urinalysis to exclude cystitis. 3. Trace free fluid in the pelvis, possibly reactive or physiologic in a reproductive age female. No free air. Electronically Signed   By: Kreg Shropshire M.D.   On: 01/28/2020 22:05      Labs: BNP (last 3 results) No results for input(s): BNP in the last 8760 hours. Basic Metabolic Panel: Recent Labs  Lab 01/28/20 1849 01/28/20 1849 01/29/20 0421 01/29/20 1640 01/30/20 0523 01/31/20 0517 02/01/20 0444  NA 134*  --  132*  --  135 135 136  K 3.0*   < > 2.5* 3.8 3.5 3.3* 3.2*  CL 101  --  103  --  106 104 103  CO2 22  --  17*  --  20* 23 25  GLUCOSE 109*  --  84  --  87 85 101*  BUN 11  --  8  --  7 9 <5*  CREATININE 0.90  --  0.88  --  0.55 0.60 0.57  CALCIUM 8.4*  --  7.1*  --  7.3* 7.4* 7.7*  MG  --   --  1.4*  --  2.2  --   --    < > = values in this interval not displayed.   Liver Function Tests: Recent Labs    Lab 01/28/20 1849  AST 16  ALT 10  ALKPHOS 37*  BILITOT 1.2  PROT 7.9  ALBUMIN 4.1   No results for input(s): LIPASE, AMYLASE in the last 168 hours. No results for input(s): AMMONIA in the last 168 hours. CBC: Recent Labs  Lab 01/28/20 1849 01/29/20 0421 01/30/20 0523 01/31/20 0517 02/01/20 0444  WBC 23.0* 21.3* 15.2* 10.8* 9.5  NEUTROABS 21.1*  --  13.9* 9.0* 7.4  HGB 13.0 10.6* 10.6* 10.0* 10.2*  HCT 37.9 31.1* 30.5* 29.0* 29.3*  MCV 94.5 94.2 93.8 93.9 93.6  PLT 274 240 215 235 308   Cardiac Enzymes: No results for input(s): CKTOTAL, CKMB, CKMBINDEX, TROPONINI in the last 168 hours. BNP: Invalid input(s): POCBNP CBG: No results for input(s): GLUCAP in the last 168 hours. D-Dimer No results for input(s): DDIMER in the last 72 hours. Hgb A1c No results for input(s): HGBA1C in the last 72 hours. Lipid Profile No results for input(s): CHOL, HDL, LDLCALC, TRIG, CHOLHDL, LDLDIRECT in the last 72 hours. Thyroid function studies No results for input(s): TSH, T4TOTAL, T3FREE, THYROIDAB in the last 72 hours.  Invalid input(s): FREET3 Anemia work up No results for input(s): VITAMINB12, FOLATE, FERRITIN, TIBC, IRON, RETICCTPCT in the last 72 hours. Urinalysis    Component Value Date/Time   COLORURINE AMBER (A) 01/28/2020 2000   APPEARANCEUR CLOUDY (A) 01/28/2020 2000   APPEARANCEUR Cloudy  01/16/2014 2332   LABSPEC 1.023 01/28/2020 2000   LABSPEC 1.014 01/16/2014 2332   PHURINE 5.0 01/28/2020 2000   GLUCOSEU NEGATIVE 01/28/2020 2000   GLUCOSEU Negative 01/16/2014 2332   HGBUR MODERATE (A) 01/28/2020 2000   BILIRUBINUR NEGATIVE 01/28/2020 2000   BILIRUBINUR Negative 01/16/2014 2332   KETONESUR 20 (A) 01/28/2020 2000   PROTEINUR 100 (A) 01/28/2020 2000   NITRITE NEGATIVE 01/28/2020 2000   LEUKOCYTESUR NEGATIVE 01/28/2020 2000   LEUKOCYTESUR 3+ 01/16/2014 2332   Sepsis Labs Invalid input(s): PROCALCITONIN,  WBC,  LACTICIDVEN Microbiology Recent Results (from  the past 240 hour(s))  Respiratory Panel by RT PCR (Flu A&B, Covid) -     Status: None   Collection Time: 01/28/20  7:53 PM   Specimen: Nasopharyngeal  Result Value Ref Range Status   SARS Coronavirus 2 by RT PCR NEGATIVE NEGATIVE Final    Comment: (NOTE) SARS-CoV-2 target nucleic acids are NOT DETECTED.  The SARS-CoV-2 RNA is generally detectable in upper respiratoy specimens during the acute phase of infection. The lowest concentration of SARS-CoV-2 viral copies this assay can detect is 131 copies/mL. A negative result does not preclude SARS-Cov-2 infection and should not be used as the sole basis for treatment or other patient management decisions. A negative result may occur with  improper specimen collection/handling, submission of specimen other than nasopharyngeal swab, presence of viral mutation(s) within the areas targeted by this assay, and inadequate number of viral copies (<131 copies/mL). A negative result must be combined with clinical observations, patient history, and epidemiological information. The expected result is Negative.  Fact Sheet for Patients:  https://www.moore.com/  Fact Sheet for Healthcare Providers:  https://www.young.biz/  This test is no t yet approved or cleared by the [place] FDA and  has been authorized for detection and/or diagnosis of SARS-CoV-2 by FDA under an Emergency Use Authorization (EUA). This EUA will remain  in effect (meaning this test can be used) for the duration of the COVID-19 declaration under Section 564(b)(1) of the Act, 21 U.S.C. section 360bbb-3(b)(1), unless the authorization is terminated or revoked sooner.     Influenza A by PCR NEGATIVE NEGATIVE Final   Influenza B by PCR NEGATIVE NEGATIVE Final    Comment: (NOTE) The Xpert Xpress SARS-CoV-2/FLU/RSV assay is intended as an aid in  the diagnosis of influenza from Nasopharyngeal swab specimens and  should not be used as a  sole basis for treatment. Nasal washings and  aspirates are unacceptable for Xpert Xpress SARS-CoV-2/FLU/RSV  testing.  Fact Sheet for Patients: https://www.moore.com/  Fact Sheet for Healthcare Providers: https://www.young.biz/  This test is not yet approved or cleared by the [place] FDA and  has been authorized for detection and/or diagnosis of SARS-CoV-2 by  FDA under an Emergency Use Authorization (EUA). This EUA will remain  in effect (meaning this test can be used) for the duration of the  Covid-19 declaration under Section 564(b)(1) of the Act, 21  U.S.C. section 360bbb-3(b)(1), unless the authorization is  terminated or revoked. Performed at [facility], [address], [place], [place] [address]   Urine Culture     Status: Abnormal   Collection Time: 01/28/20  8:00 PM   Specimen: Urine, Random  Result Value Ref Range Status   Specimen Description   Final    URINE, RANDOM Performed at [facility], [address], [place], [place] [address]    Special Requests   Final    NONE Performed at [facility], [address] [place], [place] [address]    Culture (A)  Final    <10,000 COLONIES/mL INSIGNIFICANT GROWTH Performed at [facility], [address], [place], [place] [address]    Report Status 01/30/2020 FINAL  Final  Gastrointestinal Panel by PCR , Stool     Status: None   Collection Time: 01/29/20 10:06 AM   Specimen: Stool  Result Value Ref Range Status   Campylobacter species NOT DETECTED NOT DETECTED Final   Plesimonas shigelloides NOT DETECTED NOT DETECTED Final   Salmonella species NOT DETECTED NOT DETECTED Final   Yersinia enterocolitica NOT DETECTED NOT DETECTED Final   Vibrio species NOT DETECTED NOT DETECTED Final   Vibrio cholerae NOT DETECTED NOT DETECTED Final   Enteroaggregative E coli (EAEC) NOT DETECTED NOT DETECTED Final   Enteropathogenic E coli (EPEC) NOT  DETECTED NOT DETECTED Final   Enterotoxigenic E coli (ETEC) NOT DETECTED NOT DETECTED Final   Shiga like toxin producing E coli (STEC) NOT DETECTED NOT DETECTED Final   Shigella/Enteroinvasive E coli (EIEC) NOT DETECTED NOT DETECTED Final   Cryptosporidium NOT DETECTED NOT DETECTED Final   Cyclospora cayetanensis NOT DETECTED NOT DETECTED Final   Entamoeba histolytica NOT DETECTED NOT DETECTED Final   Giardia lamblia NOT DETECTED NOT DETECTED Final   Adenovirus F40/41 NOT DETECTED NOT DETECTED Final   Astrovirus NOT DETECTED NOT DETECTED Final   Norovirus GI/GII NOT DETECTED NOT DETECTED Final   Rotavirus A NOT DETECTED NOT DETECTED Final   Sapovirus (I, II, IV, and V) NOT DETECTED NOT DETECTED Final    Comment: Performed at [facility], [address] [place], [place] [address]  CULTURE, BLOOD (ROUTINE X 2) w Reflex to ID Panel     Status: None (Preliminary result)   Collection Time: 01/29/20 10:06 AM   Specimen: BLOOD  Result Value Ref Range Status   Specimen Description BLOOD RIGHT ANTECUBITAL  Final   Special Requests   Final    BOTTLES DRAWN AEROBIC AND ANAEROBIC Blood Culture results may not be optimal due to an excessive volume of blood received in culture bottles   Culture   Final    NO GROWTH 3 DAYS Performed at [facility], [address] [place], [place] [address]    Report Status PENDING  Incomplete  CULTURE, BLOOD (ROUTINE X 2) w Reflex to ID Panel     Status: None (Preliminary result)   Collection Time: 01/29/20 10:06 AM   Specimen: BLOOD  Result Value Ref Range Status   Specimen Description BLOOD RIGHT ANTECUBITAL  Final   Special Requests   Final    BOTTLES DRAWN AEROBIC AND ANAEROBIC Blood Culture results may not be optimal due to an excessive volume of blood received in culture bottles   Culture   Final    NO GROWTH 3 DAYS Performed at [facility], [address] [place], [place] [address]    Report Status PENDING  Incomplete   C Difficile Quick Screen w PCR reflex     Status: None   Collection Time: 01/29/20 10:06 AM   Specimen: STOOL  Result Value Ref Range Status   C Diff antigen NEGATIVE NEGATIVE Final   C Diff toxin NEGATIVE NEGATIVE Final   C Diff interpretation No C. difficile detected.  Final    Comment: Performed at [facility], [address] [place], [place] [address]     Total time spend on discharging this patient, including the last patient exam, discussing the hospital stay, instructions  for ongoing care as it relates to all pertinent caregivers, as well as preparing the medical discharge records, prescriptions, and/or referrals as applicable, is 45 minutes.    Darlin Priestlyina Solene Hereford, MD  [facility] 02/01/2020, 4:44 PM  If 7PM-7AM, please contact night-coverage

## 2020-02-01 NOTE — Progress Notes (Signed)
Made Dr. Fran Lowes aware patient is unable to tolerate iv potassium. Per GI diet can be regular. Dr Fran Lowes changed potassium to po and discontinue iv fluids. Will continue to monitor

## 2020-02-02 ENCOUNTER — Encounter: Payer: Self-pay | Admitting: Gastroenterology

## 2020-02-02 LAB — SURGICAL PATHOLOGY

## 2020-02-03 LAB — CULTURE, BLOOD (ROUTINE X 2)
Culture: NO GROWTH
Culture: NO GROWTH

## 2021-02-17 IMAGING — CT CT ABD-PELV W/ CM
2 of 4 series · 15 of 46 positions shown, 17 images · IV contrast (omnipaque)
Comparison: CT abdomen pelvis 01/28/2020

CLINICAL DATA: Abdominal distension. Persistent pain. Follow-up
exam.

EXAM:
CT ABDOMEN AND PELVIS WITH CONTRAST
TECHNIQUE: Multidetector CT imaging of the abdomen and pelvis was performed
using the standard protocol following bolus administration of
intravenous contrast.
CONTRAST:  80mL OMNIPAQUE IOHEXOL 300 MG/ML  SOLN

[Series 2: routine abd/pel with · axial · 0.66mm/px · z∈[-1382,-986]mm · 12 of 93 slices shown, 14 images]
[im 7/93  soft-tissue]
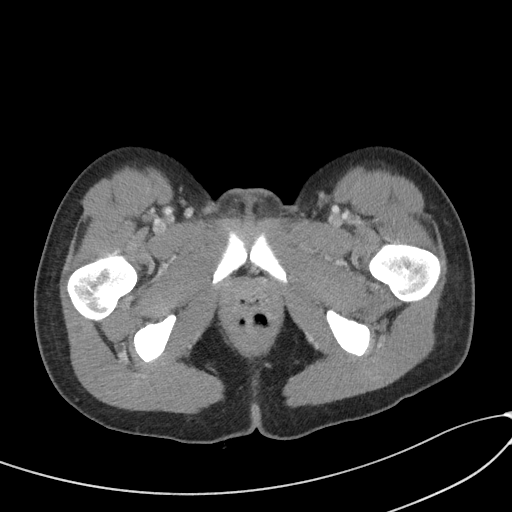
[im 7/93  bone]
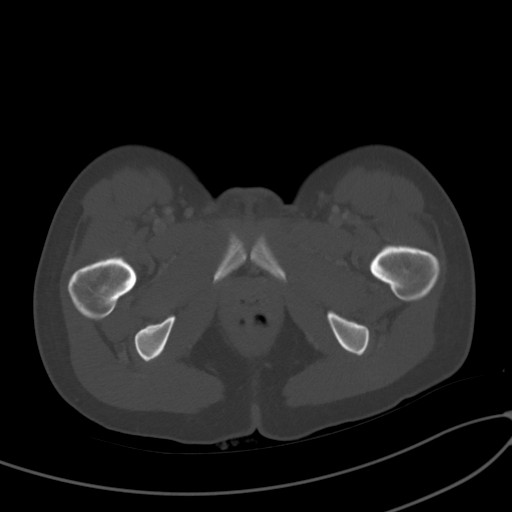
[im 14/93  soft-tissue]
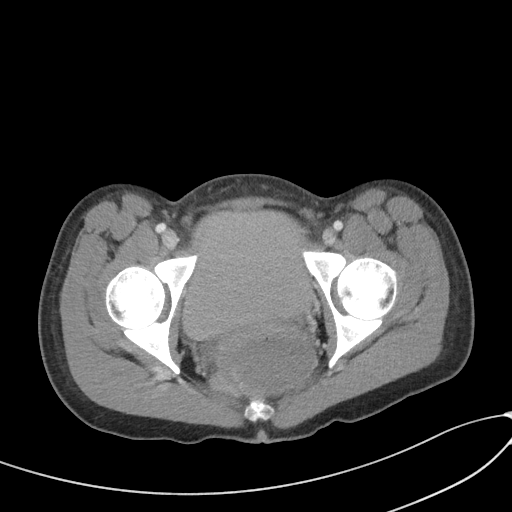
[im 20/93  soft-tissue]
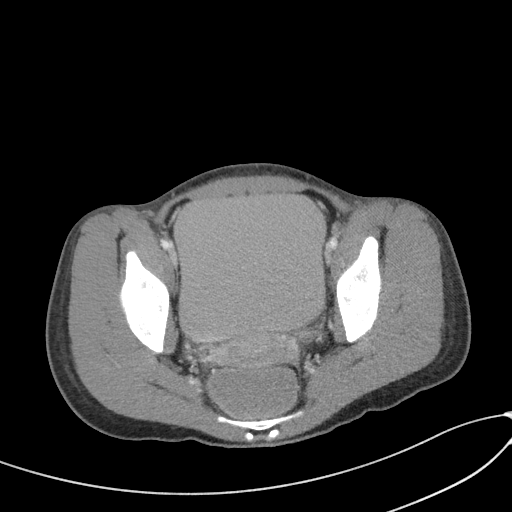
[im 27/93  soft-tissue]
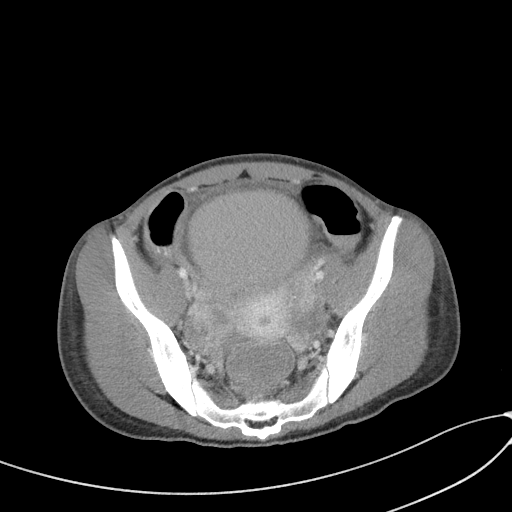
[im 37/93  soft-tissue]
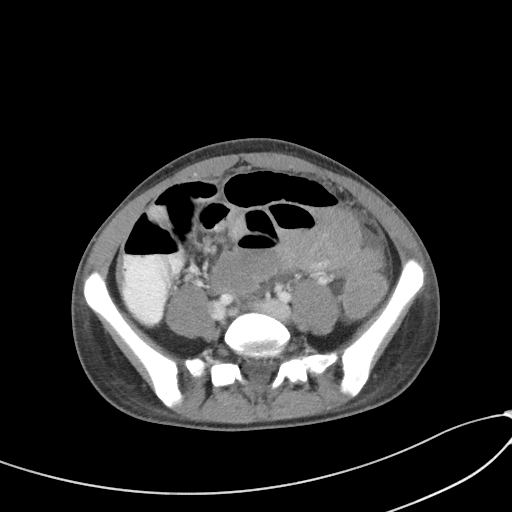
[im 43/93  soft-tissue]
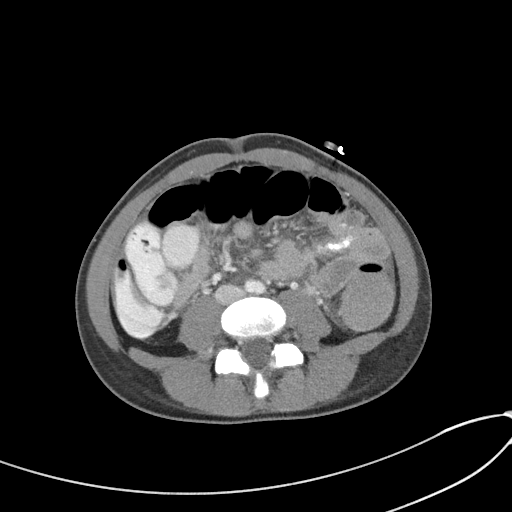
[im 50/93  soft-tissue]
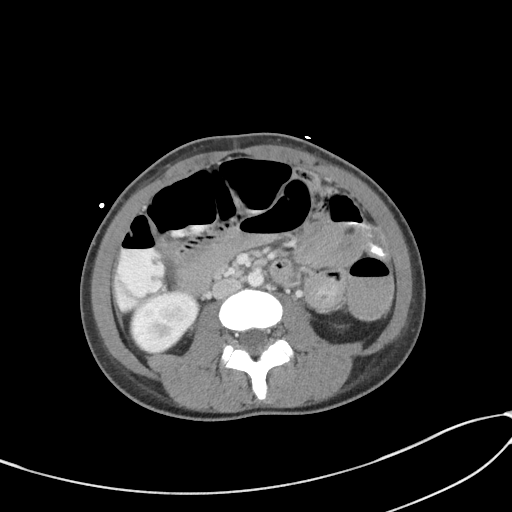
[im 56/93  soft-tissue]
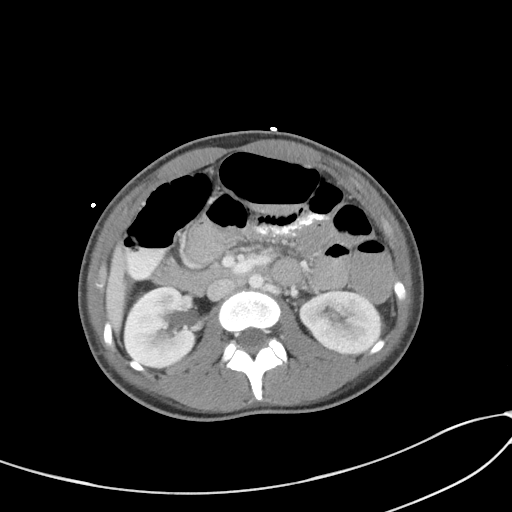
[im 66/93  soft-tissue]
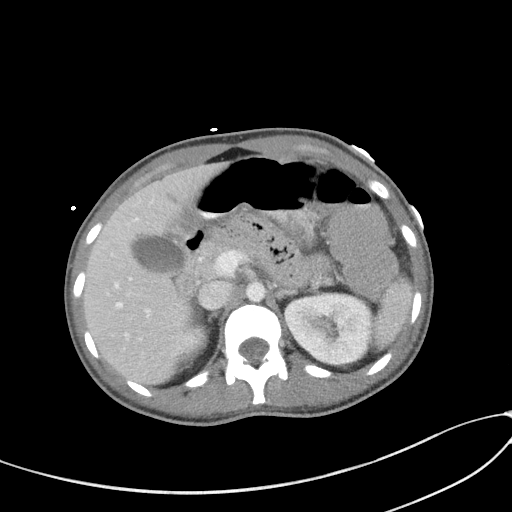
[im 66/93  bone]
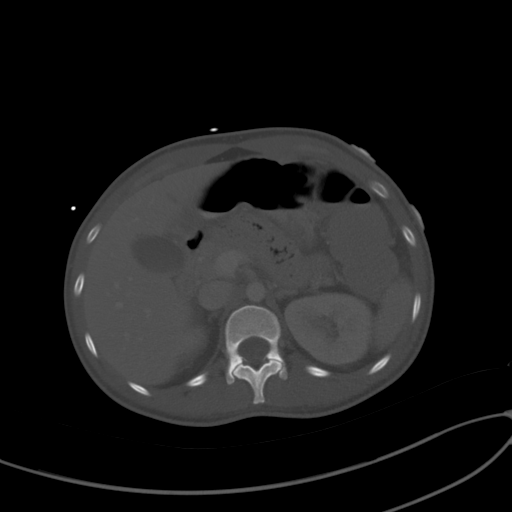
[im 73/93  soft-tissue]
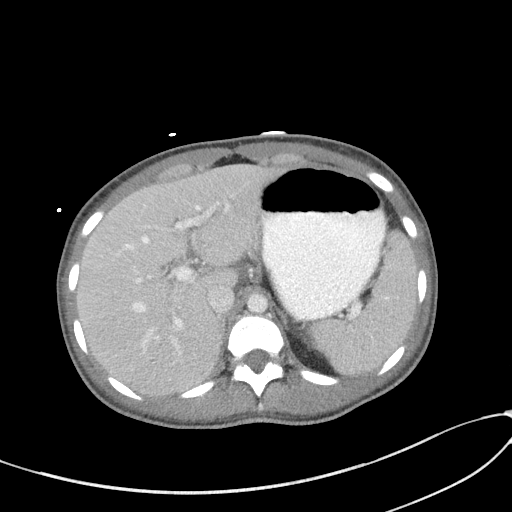
[im 79/93  soft-tissue]
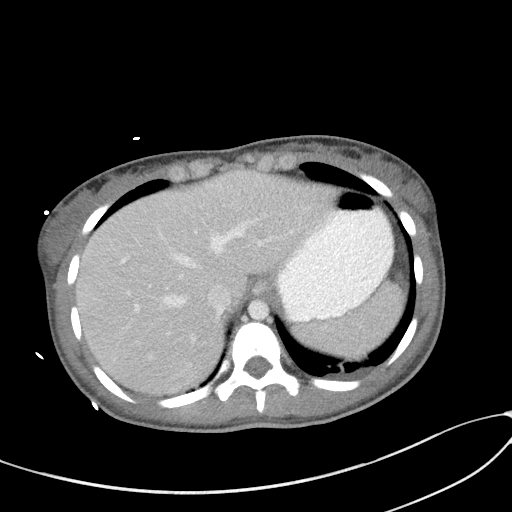
[im 86/93  soft-tissue]
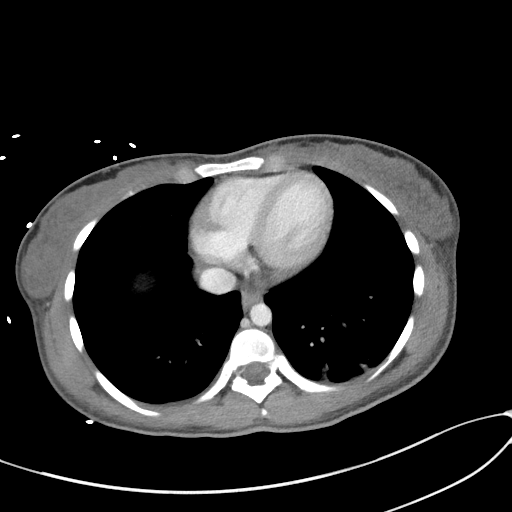

[Series 5: coronal st · coronal · 0.55mm/px · 3 of 72 slices shown]
[im 24/72  soft-tissue]
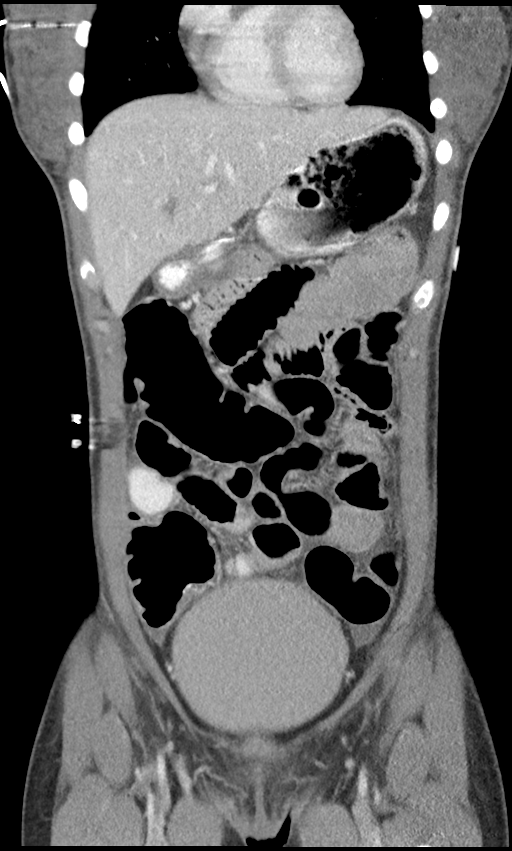
[im 32/72  soft-tissue]
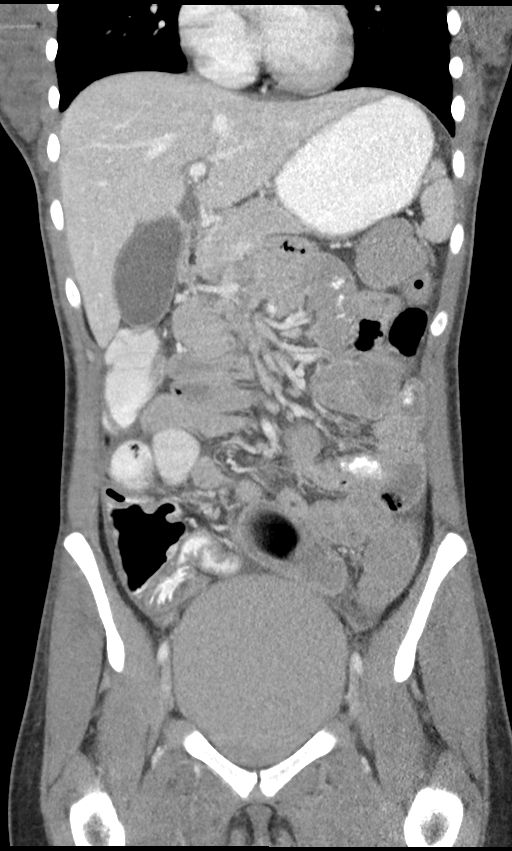
[im 40/72  soft-tissue]
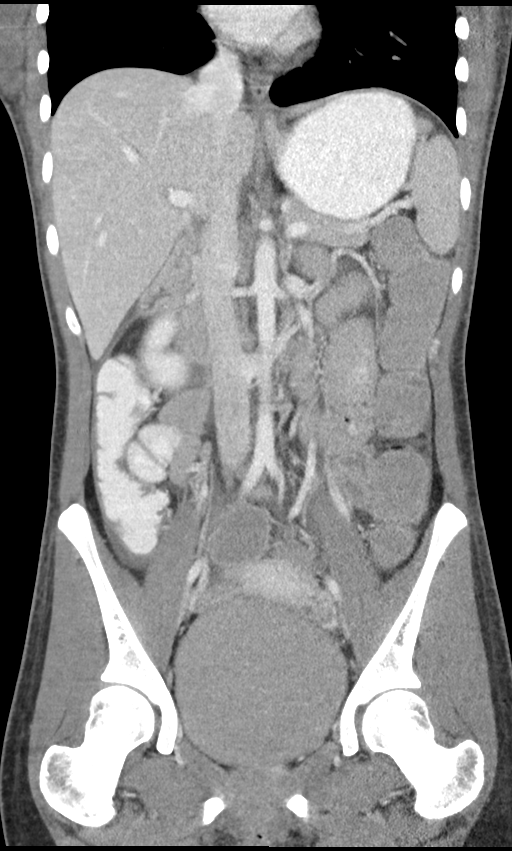

[15 of 46 positions shown; findings below may reference images not displayed]

FINDINGS: Lower chest: Normal heart size. Subpleural ground-glass and
consolidative opacities within the lower lobes bilaterally favored
to represent atelectasis.

Hepatobiliary: Liver is normal in size and contour. Gallbladder is
unremarkable.

Pancreas: Unremarkable

Spleen: Unremarkable

Adrenals/Urinary Tract: Normal adrenal glands. Kidneys enhance
symmetrically with contrast. No hydronephrosis. Contrast/dense
material within the urinary bladder.

Stomach/Bowel: Fluid throughout the colon. No evidence for small
bowel distension there is mild wall thickening of the distal ileum.
Few scattered mildly thickened loops of small bowel within the
central abdomen. There is wall thickening and small amount of fluid
about the cecum and ascending colon. Normal morphology of the
stomach and proximal duodenum. No free intraperitoneal air.

Vascular/Lymphatic: Normal caliber abdominal aorta. No
retroperitoneal lymphadenopathy.

Reproductive: Uterus and adnexal structures are unremarkable.

Other: None.

Musculoskeletal: No aggressive or acute appearing osseous lesions.
IMPRESSION: 1. Fluid throughout the colon as can be seen with diarrhea.
2. There is wall thickening and small amount of fluid about the
cecum and ascending colon. Additionally, there are a few mildly
thick walled loops of small bowel within the central abdomen.
Findings are nonspecific however may be secondary to an infectious
or inflammatory process.
3. Small amount of fluid within the lower abdomen.
4. Subpleural ground-glass and consolidative opacities within the
lower lobes bilaterally favored to represent atelectasis.

## 2021-04-08 ENCOUNTER — Emergency Department
Admission: EM | Admit: 2021-04-08 | Discharge: 2021-04-08 | Disposition: A | Discharge status: Home / Self Care | Payer: Self-pay | Attending: Emergency Medicine | Admitting: Emergency Medicine

## 2021-04-08 ENCOUNTER — Other Ambulatory Visit: Payer: Self-pay

## 2021-04-08 DIAGNOSIS — U071 COVID-19: Secondary | ICD-10-CM | POA: Insufficient documentation

## 2021-04-08 DIAGNOSIS — R000 Tachycardia, unspecified: Secondary | ICD-10-CM | POA: Insufficient documentation

## 2021-04-08 LAB — RESP PANEL BY RT-PCR (FLU A&B, COVID) ARPGX2
Influenza A by PCR: NEGATIVE
Influenza B by PCR: NEGATIVE
SARS Coronavirus 2 by RT PCR: POSITIVE — AB

## 2021-04-08 MED ORDER — ONDANSETRON HCL 4 MG PO TABS: 4.0000 mg | ORAL_TABLET | Freq: Three times a day (TID) | ORAL | 0 refills | Status: DC | PRN

## 2021-04-08 MED ORDER — NIRMATRELVIR/RITONAVIR (PAXLOVID)TABLET: 3.0000 | ORAL_TABLET | Freq: Two times a day (BID) | ORAL | 0 refills | Status: AC

## 2021-04-08 MED ORDER — ONDANSETRON 4 MG PO TBDP
4.0000 mg | ORAL_TABLET | Freq: Once | ORAL | Status: AC
  Administered 2021-04-08: 20:00:00 4 mg via ORAL
  Filled 2021-04-08: qty 1

## 2021-04-08 NOTE — ED Provider Notes (Signed)
[facility] Emergency Department Provider Note  ____________________________________________   Event Date/Time   First MD Initiated Contact with Patient 04/08/21 1956     (approximate)  I have reviewed the triage vital signs and the nursing notes.   HISTORY  Chief Complaint No chief complaint on file.   HPI Kathryn Payne is a 20 y.o. female with below noted past medical history who presents for assessment of 1 day of cough, congestion, sore throat, fevers, chills, body aches and some nausea and episode of nonbloody nonbilious vomiting.  No specific focal chest pain, hemoptysis, diarrhea, urinary symptoms, rash or extremity pain.  No recent falls or injuries or other clear acute associated sick symptoms.  Patient took some over-the-counter flu medicines earlier today but no other medications for the symptoms.  No recent illicit or regular EtOH use.  No other acute concerns at this time.         History reviewed. No pertinent past medical history.  Patient Active Problem List   Diagnosis Date Noted   Diarrhea    Abdominal pain    Enterocolitis 01/29/2020   Hyponatremia 01/29/2020   Hypokalemia 01/29/2020   Sepsis (HCC) 01/28/2020    Past Surgical History:  Procedure Laterality Date   COLONOSCOPY N/A 02/01/2020   Procedure: COLONOSCOPY;  Surgeon: Pasty Spillers, MD;  Location: [facility] ENDOSCOPY;  Service: Endoscopy;  Laterality: N/A;   TONSILLECTOMY     WISDOM TOOTH EXTRACTION      Prior to Admission medications   Medication Sig Start Date End Date Taking? Authorizing Provider  nirmatrelvir/ritonavir EUA (PAXLOVID) 20 x 150 MG & 10 x 100MG  TABS Take 3 tablets by mouth 2 (two) times daily for 5 days. Patient GFR is >60. Take nirmatrelvir (150 mg) two tablets twice daily for 5 days and ritonavir (100 mg) one tablet twice daily for 5 days. 04/08/21 04/13/21 Yes 04/15/21, MD  ondansetron (ZOFRAN) 4 MG tablet Take 1 tablet (4 mg total) by  mouth every 8 (eight) hours as needed for up to 10 doses for nausea or vomiting. 04/08/21  Yes 04/10/21, MD  albuterol (PROVENTIL HFA;VENTOLIN HFA) 108 (90 Base) MCG/ACT inhaler Inhale 2 puffs into the lungs every 4 (four) hours as needed for wheezing or shortness of breath. 06/16/18 03/27/19  Cuthriell, 14/6/20, PA-C    Allergies Patient has no known allergies.  No family history on file.  Social History Social History   Tobacco Use   Smoking status: Never   Smokeless tobacco: Never  Substance Use Topics   Alcohol use: Never   Drug use: Never    Review of Systems  Review of Systems  Constitutional:  Positive for chills and fever.  HENT:  Positive for congestion and sore throat.   Eyes:  Negative for pain.  Respiratory:  Positive for cough. Negative for stridor.   Cardiovascular:  Negative for chest pain.  Gastrointestinal:  Positive for nausea and vomiting.  Genitourinary:  Negative for dysuria.  Musculoskeletal:  Positive for myalgias.  Skin:  Negative for rash.  Neurological:  Positive for headaches. Negative for seizures and loss of consciousness.  Psychiatric/Behavioral:  Negative for suicidal ideas.   All other systems reviewed and are negative.    ____________________________________________   PHYSICAL EXAM:  VITAL SIGNS: ED Triage Vitals [04/08/21 1842]  Enc Vitals Group     BP (!) 141/90     Pulse Rate (!) 118     Resp 20     Temp 98.9  F (37.2 C)     Temp Source Oral     SpO2 97 %     Weight 105 lb (47.6 kg)     Height 5\' 1"  (1.549 m)     Head Circumference      Peak Flow      Pain Score 10     Pain Loc      Pain Edu?      Excl. in [place]?    Vitals:   04/08/21 1842 04/08/21 2001  BP: (!) 141/90 138/87  Pulse: (!) 118 100  Resp: 20 18  Temp: 98.9 F (37.2 C) 98.8 F (37.1 C)  SpO2: 97% 99%   Physical Exam Vitals and nursing note reviewed.  Constitutional:      General: She is not in acute distress.    Appearance: She is  well-developed.  HENT:     Head: Normocephalic and atraumatic.     Right Ear: External ear normal.     Left Ear: External ear normal.     Nose: Nose normal.     Mouth/Throat:     Mouth: Mucous membranes are moist.  Eyes:     Conjunctiva/sclera: Conjunctivae normal.  Cardiovascular:     Rate and Rhythm: Regular rhythm. Tachycardia present.     Heart sounds: No murmur heard. Pulmonary:     Effort: Pulmonary effort is normal. No respiratory distress.     Breath sounds: Normal breath sounds.  Abdominal:     Palpations: Abdomen is soft.     Tenderness: There is no abdominal tenderness.  Musculoskeletal:        General: No swelling.     Cervical back: Neck supple.  Skin:    General: Skin is warm and dry.     Capillary Refill: Capillary refill takes less than 2 seconds.  Neurological:     Mental Status: She is alert and oriented to person, place, and time.  Psychiatric:        Mood and Affect: Mood normal.     ____________________________________________   LABS (all labs ordered are listed, but only abnormal results are displayed)  Labs Reviewed  RESP PANEL BY RT-PCR (FLU A&B, COVID) ARPGX2 - Abnormal; Notable for the following components:      Result Value   SARS Coronavirus 2 by RT PCR POSITIVE (*)    All other components within normal limits   ____________________________________________  EKG   ____________________________________________  RADIOLOGY  ED MD interpretation:    Official radiology report(s): No results found.  ____________________________________________   PROCEDURES  Procedure(s) performed (including Critical Care):  Procedures   ____________________________________________   INITIAL IMPRESSION / ASSESSMENT AND PLAN / ED COURSE      Patient presents with above-stated history exam for less than 24 hours of above constellation of symptoms.  She is slight tachycardic with otherwise stable vital signs on room air.  There is no evidence of  hypoxia and she does not have any significant work of breathing or abnormal breath sounds on exam.  Impression is likely acute viral syndrome.  COVID PCR is positive.  I suspect this is the etiology for presentation today.  Influenza PCR is negative.  She reports 1 episode of vomiting and is slightly tachycardic she otherwise has less than 2-second capillary refill and very moist mucous membranes.  She does not appear significantly dehydrated septic or meningitic and have a low suspicion for significant metabolic derangement or superinfection with bacterial pneumonia at this time.  We will prescribe her a  course of Zofran PACs noted.  Advise she return immediately to emergency room if she is unable to tolerate p.o. with Zofran for any worsening of symptoms.  Discharged in stable condition.  Strict return precautions advised and discussed.         ____________________________________________   FINAL CLINICAL IMPRESSION(S) / ED DIAGNOSES  Final diagnoses:  COVID    Medications  ondansetron (ZOFRAN-ODT) disintegrating tablet 4 mg (4 mg Oral Given 04/08/21 2001)     ED Discharge Orders          Ordered    ondansetron (ZOFRAN) 4 MG tablet  Every 8 hours PRN        04/08/21 1957    nirmatrelvir/ritonavir EUA (PAXLOVID) 20 x 150 MG & 10 x 100MG  TABS  2 times daily        04/08/21 1957             Note:  This document was prepared using Dragon voice recognition software and may include unintentional dictation errors.    04/10/21, MD 04/08/21 2002

## 2021-04-08 NOTE — ED Triage Notes (Signed)
Pt to ED for sore throat, cough, body aches that started today. NAD noted

## 2022-07-04 ENCOUNTER — Ambulatory Visit
Admission: EM | Admit: 2022-07-04 | Discharge: 2022-07-04 | Disposition: A | Discharge status: Home / Self Care | Payer: Self-pay | Attending: Family Medicine | Admitting: Family Medicine

## 2022-07-04 ENCOUNTER — Encounter: Payer: Self-pay | Admitting: Emergency Medicine

## 2022-07-04 DIAGNOSIS — R070 Pain in throat: Secondary | ICD-10-CM

## 2022-07-04 DIAGNOSIS — Z1152 Encounter for screening for COVID-19: Secondary | ICD-10-CM | POA: Insufficient documentation

## 2022-07-04 DIAGNOSIS — J069 Acute upper respiratory infection, unspecified: Secondary | ICD-10-CM | POA: Insufficient documentation

## 2022-07-04 LAB — POCT RAPID STREP A (OFFICE): Rapid Strep A Screen: NEGATIVE

## 2022-07-04 MED ORDER — IBUPROFEN 800 MG PO TABS: 800.0000 mg | ORAL_TABLET | Freq: Three times a day (TID) | ORAL | 0 refills | Status: AC | PRN

## 2022-07-04 MED ORDER — BENZONATATE 100 MG PO CAPS: 100.0000 mg | ORAL_CAPSULE | Freq: Three times a day (TID) | ORAL | 0 refills | Status: AC | PRN

## 2022-07-04 NOTE — Discharge Instructions (Addendum)
Your strep test is negative.  Culture of the throat will be sent, and staff will notify you if that is in turn positive.  I think you have a different viral upper respiratory infection  Take ibuprofen 800 mg--1 tab every 8 hours as needed for pain.  Take benzonatate 100 mg, 1 tab every 8 hours as needed for cough.

## 2022-07-04 NOTE — ED Provider Notes (Signed)
[facility]    CSN: [id number] Arrival date & time: 07/04/22  1344      History   Chief Complaint Chief Complaint  Patient presents with   Sore Throat    HPI Kathryn Payne is a 22 y.o. female.    Sore Throat   Here for sore throat, nasal congestion, and cough.  She has not had any fever or chills.  No vomiting or diarrhea.  She states she was diagnosed with strep with a rapid strep swab about 2 to 3 weeks ago, and she finished antibiotics about 10 days ago.  Then 2 or 3 days ago she started having the above symptoms.  She states she feels worse now  LMP 3/5  History reviewed. No pertinent past medical history.  Patient Active Problem List   Diagnosis Date Noted   Diarrhea    Abdominal pain    Enterocolitis 01/29/2020   Hyponatremia 01/29/2020   Hypokalemia 01/29/2020   Sepsis ([place]) 01/28/2020    Past Surgical History:  Procedure Laterality Date   COLONOSCOPY N/A 02/01/2020   Procedure: COLONOSCOPY;  Surgeon: Virgel Manifold, MD;  Location: [facility] ENDOSCOPY;  Service: Endoscopy;  Laterality: N/A;   TONSILLECTOMY     WISDOM TOOTH EXTRACTION      OB History   No obstetric history on file.      Home Medications    Prior to Admission medications   Medication Sig Start Date End Date Taking? Authorizing Provider  benzonatate (TESSALON) 100 MG capsule Take 1 capsule (100 mg total) by mouth 3 (three) times daily as needed for cough. 07/04/22  Yes Barrett Henle, MD  ibuprofen (ADVIL) 800 MG tablet Take 1 tablet (800 mg total) by mouth every 8 (eight) hours as needed (pain). 07/04/22  Yes Barrett Henle, MD  albuterol (PROVENTIL HFA;VENTOLIN HFA) 108 (90 Base) MCG/ACT inhaler Inhale 2 puffs into the lungs every 4 (four) hours as needed for wheezing or shortness of breath. 06/16/18 03/27/19  Cuthriell, Charline Bills, PA-C    Family History History reviewed. No pertinent family history.  Social History Social History   Tobacco Use    Smoking status: Never   Smokeless tobacco: Never  Substance Use Topics   Alcohol use: Never   Drug use: Never     Allergies   Patient has no known allergies.   Review of Systems Review of Systems   Physical Exam Triage Vital Signs ED Triage Vitals [07/04/22 1414]  Enc Vitals Group     BP 111/78     Pulse Rate (!) 108     Resp 18     Temp 98.9 F (37.2 C)     Temp Source Oral     SpO2 100 %     Weight 100 lb (45.4 kg)     Height 5\' 1"  (1.549 m)     Head Circumference      Peak Flow      Pain Score 8     Pain Loc      Pain Edu?      Excl. in [place]?    No data found.  Updated Vital Signs BP 111/78   Pulse (!) 108   Temp 98.9 F (37.2 C) (Oral)   Resp 18   Ht 5\' 1"  (1.549 m)   Wt 45.4 kg   LMP 06/24/2022 (Approximate)   SpO2 100%   BMI 18.89 kg/m   Visual Acuity Right Eye Distance:   Left Eye Distance:   Bilateral Distance:  Right Eye Near:   Left Eye Near:    Bilateral Near:     Physical Exam Vitals reviewed.  Constitutional:      General: She is not in acute distress.    Appearance: She is not toxic-appearing.  HENT:     Right Ear: Tympanic membrane and ear canal normal.     Left Ear: Tympanic membrane and ear canal normal.     Nose: Nose normal.     Mouth/Throat:     Mouth: Mucous membranes are moist.     Comments: There is some mild erythema of the posterior oropharynx and tonsillar pillars.  Tonsils are 1+ hypertrophied Eyes:     Extraocular Movements: Extraocular movements intact.     Conjunctiva/sclera: Conjunctivae normal.     Pupils: Pupils are equal, round, and reactive to light.  Cardiovascular:     Rate and Rhythm: Normal rate and regular rhythm.     Heart sounds: No murmur heard. Pulmonary:     Effort: Pulmonary effort is normal. No respiratory distress.     Breath sounds: No stridor. No wheezing, rhonchi or rales.  Musculoskeletal:     Cervical back: Neck supple.  Lymphadenopathy:     Cervical: No cervical adenopathy.   Skin:    Capillary Refill: Capillary refill takes less than 2 seconds.     Coloration: Skin is not jaundiced or pale.  Neurological:     General: No focal deficit present.     Mental Status: She is alert and oriented to person, place, and time.  Psychiatric:        Behavior: Behavior normal.      UC Treatments / Results  Labs (all labs ordered are listed, but only abnormal results are displayed) Labs Reviewed  CULTURE, GROUP A STREP ([facility])  SARS CORONAVIRUS 2 (TAT 6-24 HRS)  POCT RAPID STREP A (OFFICE)    EKG   Radiology No results found.  Procedures Procedures (including critical care time)  Medications Ordered in UC Medications - No data to display  Initial Impression / Assessment and Plan / UC Course  I have reviewed the triage vital signs and the nursing notes.  Pertinent labs & imaging results that were available during my care of the patient were reviewed by me and considered in my medical decision making (see chart for details).        Rapid strep is negative; c/s sent and we will treat per protocol if positive.  COVID swab is done and if positive, she will know if she needs to quarantine.   Final Clinical Impressions(s) / UC Diagnoses   Final diagnoses:  Throat pain  Viral URI with cough     Discharge Instructions      Your strep test is negative.  Culture of the throat will be sent, and staff will notify you if that is in turn positive.  I think you have a different viral upper respiratory infection  Take ibuprofen 800 mg--1 tab every 8 hours as needed for pain.  Take benzonatate 100 mg, 1 tab every 8 hours as needed for cough.       ED Prescriptions     Medication Sig Dispense Auth. Provider   ibuprofen (ADVIL) 800 MG tablet Take 1 tablet (800 mg total) by mouth every 8 (eight) hours as needed (pain). 21 tablet Akshath Mccarey, Gwenlyn Perking, MD   benzonatate (TESSALON) 100 MG capsule Take 1 capsule (100 mg total) by mouth 3 (three) times  daily as needed for cough. 21 capsule  Barrett Henle, MD      PDMP not reviewed this encounter.   Barrett Henle, MD 07/04/22 [phone]

## 2022-07-04 NOTE — ED Triage Notes (Signed)
Pt was tx 2 weeks ago for strep and competed abx.  2 days ago started with sore throat, nasal congestion, headache, and productive cough with yellow mucous.  Denies fevers.

## 2022-07-05 LAB — SARS CORONAVIRUS 2 (TAT 6-24 HRS): SARS Coronavirus 2: NEGATIVE

## 2022-07-07 LAB — CULTURE, GROUP A STREP (THRC)

## 2023-02-26 ENCOUNTER — Ambulatory Visit
Admission: EM | Admit: 2023-02-26 | Discharge: 2023-02-26 | Disposition: A | Discharge status: Home / Self Care | Payer: Self-pay | Attending: Internal Medicine | Admitting: Internal Medicine

## 2023-02-26 ENCOUNTER — Other Ambulatory Visit: Payer: Self-pay

## 2023-02-26 DIAGNOSIS — J029 Acute pharyngitis, unspecified: Secondary | ICD-10-CM | POA: Insufficient documentation

## 2023-02-26 DIAGNOSIS — J069 Acute upper respiratory infection, unspecified: Secondary | ICD-10-CM | POA: Insufficient documentation

## 2023-02-26 DIAGNOSIS — R051 Acute cough: Secondary | ICD-10-CM | POA: Insufficient documentation

## 2023-02-26 LAB — POCT RAPID STREP A (OFFICE): Rapid Strep A Screen: NEGATIVE

## 2023-02-26 MED ORDER — AZITHROMYCIN 250 MG PO TABS: ORAL_TABLET | ORAL | 0 refills | Status: AC

## 2023-02-26 NOTE — ED Provider Notes (Signed)
[facility]    CSN: [id number] Arrival date & time: 02/26/23  0930      History   Chief Complaint No chief complaint on file.   HPI Kathryn Payne is a 22 y.o. female.   Patient presents with cough, sore throat, body aches, fever, nasal congestion that started about 6 to 7 days ago.  Tmax at home was 101.3 when symptoms first started.  Denies any known sick contacts.  Patient is not reporting any chest pain or shortness of breath.  Has been using over-the-counter cold and flu medications with only temporary improvement in symptoms.  Denies history of asthma.     History reviewed. No pertinent past medical history.  Patient Active Problem List   Diagnosis Date Noted   Diarrhea    Abdominal pain    Enterocolitis 01/29/2020   Hyponatremia 01/29/2020   Hypokalemia 01/29/2020   Sepsis (HCC) 01/28/2020    Past Surgical History:  Procedure Laterality Date   COLONOSCOPY N/A 02/01/2020   Procedure: COLONOSCOPY;  Surgeon: Pasty Spillers, MD;  Location: [facility] ENDOSCOPY;  Service: Endoscopy;  Laterality: N/A;   TONSILLECTOMY     WISDOM TOOTH EXTRACTION      OB History   No obstetric history on file.      Home Medications    Prior to Admission medications   Medication Sig Start Date End Date Taking? Authorizing Provider  azithromycin (ZITHROMAX Z-PAK) 250 MG tablet Take 2 tablets (500 mg total) by mouth daily for 1 day, THEN 1 tablet (250 mg total) daily for 4 days. 02/26/23 03/03/23 Yes Boris Engelmann, Acie Fredrickson, FNP  benzonatate (TESSALON) 100 MG capsule Take 1 capsule (100 mg total) by mouth 3 (three) times daily as needed for cough. 07/04/22   Zenia Resides, MD  ibuprofen (ADVIL) 800 MG tablet Take 1 tablet (800 mg total) by mouth every 8 (eight) hours as needed (pain). 07/04/22   Zenia Resides, MD  albuterol (PROVENTIL HFA;VENTOLIN HFA) 108 (90 Base) MCG/ACT inhaler Inhale 2 puffs into the lungs every 4 (four) hours as needed for wheezing or shortness of  breath. 06/16/18 03/27/19  Cuthriell, Delorise Royals, PA-C    Family History No family history on file.  Social History Social History   Tobacco Use   Smoking status: Never   Smokeless tobacco: Never  Substance Use Topics   Alcohol use: Never   Drug use: Never     Allergies   Patient has no known allergies.   Review of Systems Review of Systems Per HPI  Physical Exam Triage Vital Signs ED Triage Vitals  Encounter Vitals Group     BP 02/26/23 1041 106/60     Systolic BP Percentile --      Diastolic BP Percentile --      Pulse Rate 02/26/23 1041 71     Resp 02/26/23 1041 16     Temp 02/26/23 1041 98.7 F (37.1 C)     Temp Source 02/26/23 1041 Oral     SpO2 02/26/23 1041 99 %     Weight 02/26/23 1039 100 lb (45.4 kg)     Height 02/26/23 1039 5\' 1"  (1.549 m)     Head Circumference --      Peak Flow --      Pain Score 02/26/23 1039 9     Pain Loc --      Pain Education --      Exclude from Growth Chart --    No data found.  Updated Vital  Signs BP 106/60 (BP Location: Left Arm)   Pulse 71   Temp 98.7 F (37.1 C) (Oral)   Resp 16   Ht 5\' 1"  (1.549 m)   Wt 100 lb (45.4 kg)   LMP 02/23/2023   SpO2 99%   BMI 18.89 kg/m   Visual Acuity Right Eye Distance:   Left Eye Distance:   Bilateral Distance:    Right Eye Near:   Left Eye Near:    Bilateral Near:     Physical Exam Constitutional:      General: She is not in acute distress.    Appearance: Normal appearance. She is not toxic-appearing or diaphoretic.  HENT:     Head: Normocephalic and atraumatic.     Right Ear: Tympanic membrane and ear canal normal.     Left Ear: Tympanic membrane and ear canal normal.     Nose: Congestion present.     Mouth/Throat:     Mouth: Mucous membranes are moist.     Pharynx: Posterior oropharyngeal erythema present.  Eyes:     Extraocular Movements: Extraocular movements intact.     Conjunctiva/sclera: Conjunctivae normal.     Pupils: Pupils are equal, round, and  reactive to light.  Cardiovascular:     Rate and Rhythm: Normal rate and regular rhythm.     Pulses: Normal pulses.     Heart sounds: Normal heart sounds.  Pulmonary:     Effort: Pulmonary effort is normal. No respiratory distress.     Breath sounds: Normal breath sounds. No stridor. No wheezing, rhonchi or rales.  Abdominal:     General: Abdomen is flat. Bowel sounds are normal.     Palpations: Abdomen is soft.  Musculoskeletal:        General: Normal range of motion.     Cervical back: Normal range of motion.  Skin:    General: Skin is warm and dry.  Neurological:     General: No focal deficit present.     Mental Status: She is alert and oriented to person, place, and time. Mental status is at baseline.  Psychiatric:        Mood and Affect: Mood normal.        Behavior: Behavior normal.      UC Treatments / Results  Labs (all labs ordered are listed, but only abnormal results are displayed) Labs Reviewed  CULTURE, GROUP A STREP [facility])  POCT RAPID STREP A (OFFICE)    EKG   Radiology No results found.  Procedures Procedures (including critical care time)  Medications Ordered in UC Medications - No data to display  Initial Impression / Assessment and Plan / UC Course  I have reviewed the triage vital signs and the nursing notes.  Pertinent labs & imaging results that were available during my care of the patient were reviewed by me and considered in my medical decision making (see chart for details).     Suspect symptoms started off as a viral illness, but given duration of symptoms and persistent coughing will opt to treat for atypicals with azithromycin antibiotic.  Rapid strep is negative.  Throat culture pending.  Patient declined viral testing with shared decision making given duration of symptoms as it would not change treatment.  Advised supportive care including fluids, rest, over-the-counter medications.  Advised strict follow-up precautions if any symptoms  persist or worsen.  Patient verbalized understanding and was agreeable with plan. Final Clinical Impressions(s) / UC Diagnoses   Final diagnoses:  Acute upper respiratory infection  Acute cough  Sore throat     Discharge Instructions      Your rapid strep test was negative.  Throat culture is pending.  Will call if it is positive.  I have prescribed you an antibiotic to treat symptoms.  Follow-up if any symptoms persist or worsen.     ED Prescriptions     Medication Sig Dispense Auth. Provider   azithromycin (ZITHROMAX Z-PAK) 250 MG tablet Take 2 tablets (500 mg total) by mouth daily for 1 day, THEN 1 tablet (250 mg total) daily for 4 days. 6 tablet [place], Acie Fredrickson, [place]      PDMP not reviewed this encounter.   Gustavus Bryant, [place] 02/26/23 1116

## 2023-02-26 NOTE — Discharge Instructions (Signed)
Your rapid strep test was negative.  Throat culture is pending.  Will call if it is positive.  I have prescribed you an antibiotic to treat symptoms.  Follow-up if any symptoms persist or worsen.

## 2023-02-26 NOTE — ED Triage Notes (Signed)
Patient reports cough, sore throat, body aches, fever of 101.3 on Monday.  Symptoms started Thursday with sore throat. Treated with multi use cold medicine and Dayquil.

## 2023-03-01 LAB — CULTURE, GROUP A STREP (THRC)
